# Patient Record
Sex: Female | Born: 1940 | Race: White | Marital: Married | Smoking: Never smoker
Health system: Southern US, Community
[De-identification: ages and names within clinical notes are randomized; demographics above are authoritative.]

## PROBLEM LIST (undated history)

## (undated) DIAGNOSIS — K5790 Diverticulosis of intestine, part unspecified, without perforation or abscess without bleeding: Secondary | ICD-10-CM

## (undated) DIAGNOSIS — M1990 Unspecified osteoarthritis, unspecified site: Secondary | ICD-10-CM

## (undated) DIAGNOSIS — E785 Hyperlipidemia, unspecified: Secondary | ICD-10-CM

## (undated) DIAGNOSIS — K635 Polyp of colon: Secondary | ICD-10-CM

## (undated) DIAGNOSIS — C539 Malignant neoplasm of cervix uteri, unspecified: Secondary | ICD-10-CM

## (undated) DIAGNOSIS — R55 Syncope and collapse: Secondary | ICD-10-CM

## (undated) DIAGNOSIS — S12100A Unspecified displaced fracture of second cervical vertebra, initial encounter for closed fracture: Secondary | ICD-10-CM

## (undated) DIAGNOSIS — B009 Herpesviral infection, unspecified: Secondary | ICD-10-CM

## (undated) DIAGNOSIS — S12000A Unspecified displaced fracture of first cervical vertebra, initial encounter for closed fracture: Secondary | ICD-10-CM

## (undated) DIAGNOSIS — G43909 Migraine, unspecified, not intractable, without status migrainosus: Secondary | ICD-10-CM

## (undated) DIAGNOSIS — I639 Cerebral infarction, unspecified: Secondary | ICD-10-CM

## (undated) HISTORY — DX: Polyp of colon: K63.5

## (undated) HISTORY — DX: Herpesviral infection, unspecified: B00.9

## (undated) HISTORY — DX: Syncope and collapse: R55

## (undated) HISTORY — PX: ABDOMINAL HYSTERECTOMY: SHX81

## (undated) HISTORY — DX: Hyperlipidemia, unspecified: E78.5

## (undated) HISTORY — DX: Cerebral infarction, unspecified: I63.9

## (undated) HISTORY — DX: Unspecified displaced fracture of first cervical vertebra, initial encounter for closed fracture: S12.000A

## (undated) HISTORY — DX: Migraine, unspecified, not intractable, without status migrainosus: G43.909

## (undated) HISTORY — DX: Diverticulosis of intestine, part unspecified, without perforation or abscess without bleeding: K57.90

## (undated) HISTORY — DX: Malignant neoplasm of cervix uteri, unspecified: C53.9

## (undated) HISTORY — DX: Unspecified osteoarthritis, unspecified site: M19.90

## (undated) HISTORY — DX: Unspecified displaced fracture of second cervical vertebra, initial encounter for closed fracture: S12.100A

---

## 1980-03-23 HISTORY — PX: COSMETIC SURGERY: SHX468

## 1983-03-24 HISTORY — PX: BREAST SURGERY: SHX581

## 1997-03-23 DIAGNOSIS — S12000A Unspecified displaced fracture of first cervical vertebra, initial encounter for closed fracture: Secondary | ICD-10-CM

## 1997-03-23 DIAGNOSIS — S12100A Unspecified displaced fracture of second cervical vertebra, initial encounter for closed fracture: Secondary | ICD-10-CM

## 1997-03-23 HISTORY — DX: Unspecified displaced fracture of first cervical vertebra, initial encounter for closed fracture: S12.000A

## 1997-03-23 HISTORY — DX: Unspecified displaced fracture of second cervical vertebra, initial encounter for closed fracture: S12.100A

## 1997-03-23 HISTORY — PX: OTHER SURGICAL HISTORY: SHX169

## 2004-06-05 ENCOUNTER — Ambulatory Visit: Payer: Self-pay | Admitting: Gastroenterology

## 2005-03-09 ENCOUNTER — Ambulatory Visit: Payer: Self-pay | Admitting: Chiropractic Medicine

## 2011-06-30 DIAGNOSIS — C44721 Squamous cell carcinoma of skin of unspecified lower limb, including hip: Secondary | ICD-10-CM | POA: Diagnosis not present

## 2011-06-30 DIAGNOSIS — L570 Actinic keratosis: Secondary | ICD-10-CM | POA: Diagnosis not present

## 2011-06-30 DIAGNOSIS — L578 Other skin changes due to chronic exposure to nonionizing radiation: Secondary | ICD-10-CM | POA: Diagnosis not present

## 2011-06-30 DIAGNOSIS — L819 Disorder of pigmentation, unspecified: Secondary | ICD-10-CM | POA: Diagnosis not present

## 2011-06-30 DIAGNOSIS — L821 Other seborrheic keratosis: Secondary | ICD-10-CM | POA: Diagnosis not present

## 2011-07-07 DIAGNOSIS — J309 Allergic rhinitis, unspecified: Secondary | ICD-10-CM | POA: Diagnosis not present

## 2011-07-07 DIAGNOSIS — L0291 Cutaneous abscess, unspecified: Secondary | ICD-10-CM | POA: Diagnosis not present

## 2011-07-07 DIAGNOSIS — J029 Acute pharyngitis, unspecified: Secondary | ICD-10-CM | POA: Diagnosis not present

## 2011-07-09 DIAGNOSIS — L989 Disorder of the skin and subcutaneous tissue, unspecified: Secondary | ICD-10-CM | POA: Diagnosis not present

## 2011-07-09 DIAGNOSIS — C44721 Squamous cell carcinoma of skin of unspecified lower limb, including hip: Secondary | ICD-10-CM | POA: Diagnosis not present

## 2011-08-10 DIAGNOSIS — C44721 Squamous cell carcinoma of skin of unspecified lower limb, including hip: Secondary | ICD-10-CM | POA: Diagnosis not present

## 2011-08-10 DIAGNOSIS — L821 Other seborrheic keratosis: Secondary | ICD-10-CM | POA: Diagnosis not present

## 2011-08-31 DIAGNOSIS — B009 Herpesviral infection, unspecified: Secondary | ICD-10-CM | POA: Diagnosis not present

## 2011-08-31 DIAGNOSIS — G43909 Migraine, unspecified, not intractable, without status migrainosus: Secondary | ICD-10-CM | POA: Diagnosis not present

## 2011-08-31 DIAGNOSIS — Z1239 Encounter for other screening for malignant neoplasm of breast: Secondary | ICD-10-CM | POA: Diagnosis not present

## 2011-09-04 DIAGNOSIS — I10 Essential (primary) hypertension: Secondary | ICD-10-CM | POA: Diagnosis not present

## 2011-12-07 DIAGNOSIS — J069 Acute upper respiratory infection, unspecified: Secondary | ICD-10-CM | POA: Diagnosis not present

## 2011-12-07 DIAGNOSIS — J209 Acute bronchitis, unspecified: Secondary | ICD-10-CM | POA: Diagnosis not present

## 2012-03-21 DIAGNOSIS — R509 Fever, unspecified: Secondary | ICD-10-CM | POA: Diagnosis not present

## 2012-03-21 DIAGNOSIS — J029 Acute pharyngitis, unspecified: Secondary | ICD-10-CM | POA: Diagnosis not present

## 2012-03-30 DIAGNOSIS — J069 Acute upper respiratory infection, unspecified: Secondary | ICD-10-CM | POA: Diagnosis not present

## 2012-05-14 DIAGNOSIS — J019 Acute sinusitis, unspecified: Secondary | ICD-10-CM | POA: Diagnosis not present

## 2012-05-14 DIAGNOSIS — J209 Acute bronchitis, unspecified: Secondary | ICD-10-CM | POA: Diagnosis not present

## 2012-07-06 DIAGNOSIS — I10 Essential (primary) hypertension: Secondary | ICD-10-CM | POA: Diagnosis not present

## 2012-07-06 DIAGNOSIS — R0789 Other chest pain: Secondary | ICD-10-CM | POA: Diagnosis not present

## 2012-07-06 DIAGNOSIS — Z1231 Encounter for screening mammogram for malignant neoplasm of breast: Secondary | ICD-10-CM | POA: Diagnosis not present

## 2012-07-06 DIAGNOSIS — Z0000 Encounter for general adult medical examination without abnormal findings: Secondary | ICD-10-CM | POA: Diagnosis not present

## 2012-08-19 DIAGNOSIS — R0789 Other chest pain: Secondary | ICD-10-CM | POA: Diagnosis not present

## 2012-09-21 DIAGNOSIS — R0789 Other chest pain: Secondary | ICD-10-CM | POA: Diagnosis not present

## 2012-11-08 DIAGNOSIS — R0789 Other chest pain: Secondary | ICD-10-CM | POA: Diagnosis not present

## 2012-11-10 DIAGNOSIS — E785 Hyperlipidemia, unspecified: Secondary | ICD-10-CM | POA: Diagnosis not present

## 2012-11-10 DIAGNOSIS — B009 Herpesviral infection, unspecified: Secondary | ICD-10-CM | POA: Diagnosis not present

## 2012-12-28 DIAGNOSIS — Z23 Encounter for immunization: Secondary | ICD-10-CM | POA: Diagnosis not present

## 2013-03-30 DIAGNOSIS — M1610 Unilateral primary osteoarthritis, unspecified hip: Secondary | ICD-10-CM | POA: Diagnosis not present

## 2013-03-30 DIAGNOSIS — N949 Unspecified condition associated with female genital organs and menstrual cycle: Secondary | ICD-10-CM | POA: Diagnosis not present

## 2013-03-30 DIAGNOSIS — M169 Osteoarthritis of hip, unspecified: Secondary | ICD-10-CM | POA: Diagnosis not present

## 2013-04-07 DIAGNOSIS — J069 Acute upper respiratory infection, unspecified: Secondary | ICD-10-CM | POA: Diagnosis not present

## 2013-04-07 DIAGNOSIS — J029 Acute pharyngitis, unspecified: Secondary | ICD-10-CM | POA: Diagnosis not present

## 2013-07-28 ENCOUNTER — Encounter (INDEPENDENT_AMBULATORY_CARE_PROVIDER_SITE_OTHER): Payer: Self-pay

## 2013-07-28 ENCOUNTER — Ambulatory Visit (INDEPENDENT_AMBULATORY_CARE_PROVIDER_SITE_OTHER): Payer: Medicare Other | Admitting: Adult Health

## 2013-07-28 ENCOUNTER — Encounter: Payer: Self-pay | Admitting: Adult Health

## 2013-07-28 VITALS — BP 130/78 | HR 75 | Temp 98.1°F | Resp 12 | Ht 63.0 in | Wt 144.8 lb

## 2013-07-28 DIAGNOSIS — G43909 Migraine, unspecified, not intractable, without status migrainosus: Secondary | ICD-10-CM | POA: Diagnosis not present

## 2013-07-28 DIAGNOSIS — B009 Herpesviral infection, unspecified: Secondary | ICD-10-CM

## 2013-07-28 NOTE — Progress Notes (Signed)
Pre visit review using our clinic review tool, if applicable. No additional management support is needed unless otherwise documented below in the visit note. 
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
## 2013-07-29 ENCOUNTER — Encounter: Payer: Self-pay | Admitting: Adult Health

## 2013-07-29 DIAGNOSIS — B009 Herpesviral infection, unspecified: Secondary | ICD-10-CM | POA: Insufficient documentation

## 2013-07-29 DIAGNOSIS — G43909 Migraine, unspecified, not intractable, without status migrainosus: Secondary | ICD-10-CM | POA: Insufficient documentation

## 2013-07-29 MED ORDER — VALACYCLOVIR HCL 500 MG PO TABS: 500.0000 mg | ORAL_TABLET | Freq: Every day | ORAL | Status: DC

## 2013-08-16 ENCOUNTER — Other Ambulatory Visit (HOSPITAL_COMMUNITY)
Admission: RE | Admit: 2013-08-16 | Discharge: 2013-08-16 | Disposition: A | Discharge status: Home / Self Care | Payer: Medicare Other | Source: Ambulatory Visit | Attending: Adult Health | Admitting: Adult Health

## 2013-08-16 ENCOUNTER — Encounter: Payer: Self-pay | Admitting: Adult Health

## 2013-08-16 ENCOUNTER — Ambulatory Visit (INDEPENDENT_AMBULATORY_CARE_PROVIDER_SITE_OTHER): Payer: Medicare Other | Admitting: Adult Health

## 2013-08-16 VITALS — BP 126/70 | HR 72 | Temp 98.3°F | Resp 14 | Ht 63.0 in | Wt 146.5 lb

## 2013-08-16 DIAGNOSIS — E785 Hyperlipidemia, unspecified: Secondary | ICD-10-CM | POA: Diagnosis not present

## 2013-08-16 DIAGNOSIS — Z1239 Encounter for other screening for malignant neoplasm of breast: Secondary | ICD-10-CM

## 2013-08-16 DIAGNOSIS — R5383 Other fatigue: Secondary | ICD-10-CM | POA: Diagnosis not present

## 2013-08-16 DIAGNOSIS — Z124 Encounter for screening for malignant neoplasm of cervix: Secondary | ICD-10-CM | POA: Diagnosis not present

## 2013-08-16 DIAGNOSIS — Z13820 Encounter for screening for osteoporosis: Secondary | ICD-10-CM | POA: Diagnosis not present

## 2013-08-16 DIAGNOSIS — R5381 Other malaise: Secondary | ICD-10-CM

## 2013-08-16 DIAGNOSIS — Z23 Encounter for immunization: Secondary | ICD-10-CM

## 2013-08-16 DIAGNOSIS — Z1151 Encounter for screening for human papillomavirus (HPV): Secondary | ICD-10-CM | POA: Diagnosis not present

## 2013-08-16 DIAGNOSIS — Z9189 Other specified personal risk factors, not elsewhere classified: Secondary | ICD-10-CM

## 2013-08-16 DIAGNOSIS — E538 Deficiency of other specified B group vitamins: Secondary | ICD-10-CM

## 2013-08-16 DIAGNOSIS — I10 Essential (primary) hypertension: Secondary | ICD-10-CM | POA: Diagnosis not present

## 2013-08-16 DIAGNOSIS — E559 Vitamin D deficiency, unspecified: Secondary | ICD-10-CM

## 2013-08-16 DIAGNOSIS — Z0000 Encounter for general adult medical examination without abnormal findings: Secondary | ICD-10-CM | POA: Diagnosis not present

## 2013-08-16 LAB — CBC WITH DIFFERENTIAL/PLATELET
BASOS ABS: 0 10*3/uL (ref 0.0–0.1)
Basophils Relative: 0.4 % (ref 0.0–3.0)
EOS ABS: 0.1 10*3/uL (ref 0.0–0.7)
Eosinophils Relative: 1.2 % (ref 0.0–5.0)
HCT: 38 % (ref 36.0–46.0)
HEMOGLOBIN: 12.8 g/dL (ref 12.0–15.0)
Lymphocytes Relative: 28.4 % (ref 12.0–46.0)
Lymphs Abs: 1.7 10*3/uL (ref 0.7–4.0)
MCHC: 33.6 g/dL (ref 30.0–36.0)
MCV: 86 fl (ref 78.0–100.0)
Monocytes Absolute: 0.5 10*3/uL (ref 0.1–1.0)
Monocytes Relative: 7.8 % (ref 3.0–12.0)
NEUTROS ABS: 3.7 10*3/uL (ref 1.4–7.7)
Neutrophils Relative %: 62.2 % (ref 43.0–77.0)
Platelets: 250 10*3/uL (ref 150.0–400.0)
RBC: 4.41 Mil/uL (ref 3.87–5.11)
RDW: 13.6 % (ref 11.5–15.5)
WBC: 5.9 10*3/uL (ref 4.0–10.5)

## 2013-08-16 LAB — LIPID PANEL
CHOLESTEROL: 217 mg/dL — AB (ref 0–200)
HDL: 56.4 mg/dL (ref >39.00)
LDL Cholesterol: 141 mg/dL — ABNORMAL HIGH (ref 0–99)
Total CHOL/HDL Ratio: 4
Triglycerides: 99 mg/dL (ref 0.0–149.0)
VLDL: 19.8 mg/dL (ref 0.0–40.0)

## 2013-08-16 LAB — VITAMIN B12: VITAMIN B 12: 627 pg/mL (ref 211–911)

## 2013-08-16 LAB — COMPREHENSIVE METABOLIC PANEL
ALBUMIN: 4 g/dL (ref 3.5–5.2)
ALT: 18 U/L (ref 0–35)
AST: 21 U/L (ref 0–37)
Alkaline Phosphatase: 71 U/L (ref 39–117)
BUN: 13 mg/dL (ref 6–23)
CO2: 25 mEq/L (ref 19–32)
Calcium: 9.2 mg/dL (ref 8.4–10.5)
Chloride: 104 mEq/L (ref 96–112)
Creatinine, Ser: 0.8 mg/dL (ref 0.4–1.2)
GFR: 79.11 mL/min (ref >60.00)
GLUCOSE: 94 mg/dL (ref 70–99)
POTASSIUM: 3.9 meq/L (ref 3.5–5.1)
Sodium: 138 mEq/L (ref 135–145)
Total Bilirubin: 0.5 mg/dL (ref 0.2–1.2)
Total Protein: 6.9 g/dL (ref 6.0–8.3)

## 2013-08-16 LAB — TSH: TSH: 1 u[IU]/mL (ref 0.35–4.50)

## 2013-08-16 MED ORDER — ZOSTER VACCINE LIVE 19400 UNT/0.65ML ~~LOC~~ SOLR: 0.6500 mL | Freq: Once | SUBCUTANEOUS | Status: DC

## 2013-08-16 MED ORDER — LISINOPRIL 10 MG PO TABS
10.0000 mg | ORAL_TABLET | Freq: Every day | ORAL | Status: DC
Start: 2013-08-16 — End: 2014-08-16
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
## 2013-08-16 NOTE — Progress Notes (Signed)
Pre visit review using our clinic review tool, if applicable. No additional management support is needed unless otherwise documented below in the visit note. 

## 2013-08-16 NOTE — Patient Instructions (Signed)
  You had your medicare wellness exam today.  Please have labs drawn prior to leaving the office. We will contact you with results once they are available as well as any other instructions.  I have sent in a prescription for the shingles vaccine to your pharmacy for them to administer.  You received your Prevnar vaccine today (this is a pneumonia vaccine covered by Medicare).  Please schedule your mammogram at [facility] and have them send me the results.  We will call you with an appointment for your bone density exam.   I have sent in a refill for lisinopril to your pharmacy.

## 2013-08-17 LAB — VITAMIN D 25 HYDROXY (VIT D DEFICIENCY, FRACTURES): Vit D, 25-Hydroxy: 38 ng/mL (ref 30–89)

## 2013-08-23 ENCOUNTER — Encounter: Payer: Self-pay | Admitting: *Deleted

## 2013-09-20 DIAGNOSIS — H35369 Drusen (degenerative) of macula, unspecified eye: Secondary | ICD-10-CM | POA: Diagnosis not present

## 2013-09-20 DIAGNOSIS — H11009 Unspecified pterygium of unspecified eye: Secondary | ICD-10-CM | POA: Diagnosis not present

## 2013-09-20 DIAGNOSIS — H5210 Myopia, unspecified eye: Secondary | ICD-10-CM | POA: Diagnosis not present

## 2013-09-20 DIAGNOSIS — H2510 Age-related nuclear cataract, unspecified eye: Secondary | ICD-10-CM | POA: Diagnosis not present

## 2013-11-17 ENCOUNTER — Telehealth: Payer: Self-pay | Admitting: Internal Medicine

## 2013-11-17 MED ORDER — BUTALBITAL-APAP-CAFFEINE 50-325-40 MG PO TABS: 1.0000 | ORAL_TABLET | Freq: Two times a day (BID) | ORAL | Status: DC | PRN

## 2013-11-17 NOTE — Telephone Encounter (Signed)
Ok to refill,  printed rx  

## 2013-11-17 NOTE — Telephone Encounter (Signed)
Script sent as requested. 

## 2013-11-17 NOTE — Telephone Encounter (Signed)
Called for refill on Fioricet please advise

## 2013-12-29 ENCOUNTER — Telehealth: Payer: Self-pay | Admitting: Adult Health

## 2013-12-29 NOTE — Telephone Encounter (Signed)
Patient called left message needing medication refill but di not leave the medication name. Returned call to patient but no answer an Unable to leave message.

## 2014-01-01 NOTE — Telephone Encounter (Signed)
Called, left message for patient. Advised to contact pharmacy for refill request. Requested call back if needing any further assistance

## 2014-03-26 DIAGNOSIS — Z23 Encounter for immunization: Secondary | ICD-10-CM | POA: Diagnosis not present

## 2014-05-29 DIAGNOSIS — M62830 Muscle spasm of back: Secondary | ICD-10-CM | POA: Diagnosis not present

## 2014-05-29 DIAGNOSIS — M9903 Segmental and somatic dysfunction of lumbar region: Secondary | ICD-10-CM | POA: Diagnosis not present

## 2014-05-29 DIAGNOSIS — M955 Acquired deformity of pelvis: Secondary | ICD-10-CM | POA: Diagnosis not present

## 2014-05-29 DIAGNOSIS — M9905 Segmental and somatic dysfunction of pelvic region: Secondary | ICD-10-CM | POA: Diagnosis not present

## 2014-05-31 DIAGNOSIS — M955 Acquired deformity of pelvis: Secondary | ICD-10-CM | POA: Diagnosis not present

## 2014-05-31 DIAGNOSIS — M9905 Segmental and somatic dysfunction of pelvic region: Secondary | ICD-10-CM | POA: Diagnosis not present

## 2014-05-31 DIAGNOSIS — M62830 Muscle spasm of back: Secondary | ICD-10-CM | POA: Diagnosis not present

## 2014-05-31 DIAGNOSIS — M9903 Segmental and somatic dysfunction of lumbar region: Secondary | ICD-10-CM | POA: Diagnosis not present

## 2014-06-18 ENCOUNTER — Other Ambulatory Visit: Payer: Self-pay | Admitting: Internal Medicine

## 2014-06-19 ENCOUNTER — Encounter: Payer: Self-pay | Admitting: Nurse Practitioner

## 2014-06-19 ENCOUNTER — Ambulatory Visit (INDEPENDENT_AMBULATORY_CARE_PROVIDER_SITE_OTHER): Payer: Medicare Other | Admitting: Nurse Practitioner

## 2014-06-19 ENCOUNTER — Ambulatory Visit (INDEPENDENT_AMBULATORY_CARE_PROVIDER_SITE_OTHER)
Admission: RE | Admit: 2014-06-19 | Discharge: 2014-06-19 | Disposition: A | Discharge status: Home / Self Care | Payer: Medicare Other | Source: Ambulatory Visit | Attending: Nurse Practitioner | Admitting: Nurse Practitioner

## 2014-06-19 VITALS — BP 130/70 | HR 74 | Temp 98.3°F | Resp 14 | Ht 63.0 in | Wt 143.8 lb

## 2014-06-19 DIAGNOSIS — S8992XA Unspecified injury of left lower leg, initial encounter: Secondary | ICD-10-CM | POA: Diagnosis not present

## 2014-06-19 DIAGNOSIS — M25562 Pain in left knee: Secondary | ICD-10-CM

## 2014-06-19 MED ORDER — KETOPROFEN 75 MG PO CAPS: 75.0000 mg | ORAL_CAPSULE | Freq: Three times a day (TID) | ORAL | Status: DC

## 2014-06-19 NOTE — Patient Instructions (Signed)
[place] at [facility] Address:  Address: [address], [place] [address]  [phone] X-rays until 4 pm  Knee Pain The knee is the complex joint between your thigh and your lower leg. It is made up of bones, tendons, ligaments, and cartilage. The bones that make up the knee are:  The femur in the thigh.  The tibia and fibula in the lower leg.  The patella or kneecap riding in the groove on the lower femur. CAUSES  Knee pain is a common complaint with many causes. A few of these causes are:  Injury, such as:  A ruptured ligament or tendon injury.  Torn cartilage.  Medical conditions, such as:  Gout  Arthritis  Infections  Overuse, over training, or overdoing a physical activity. Knee pain can be minor or severe. Knee pain can accompany debilitating injury. Minor knee problems often respond well to self-care measures or get well on their own. More serious injuries may need medical intervention or even surgery. SYMPTOMS The knee is complex. Symptoms of knee problems can vary widely. Some of the problems are:  Pain with movement and weight bearing.  Swelling and tenderness.  Buckling of the knee.  Inability to straighten or extend your knee.  Your knee locks and you cannot straighten it.  Warmth and redness with pain and fever.  Deformity or dislocation of the kneecap. DIAGNOSIS  Determining what is wrong may be very straight forward such as when there is an injury. It can also be challenging because of the complexity of the knee. Tests to make a diagnosis may include:  Your caregiver taking a history and doing a physical exam.  Routine X-rays can be used to rule out other problems. X-rays will not reveal a cartilage tear. Some injuries of the knee can be diagnosed by:  Arthroscopy a surgical technique by which a small video camera is inserted through tiny incisions on the sides of the knee. This procedure is used to examine and  repair internal knee joint problems. Tiny instruments can be used during arthroscopy to repair the torn knee cartilage (meniscus).  Arthrography is a radiology technique. A contrast liquid is directly injected into the knee joint. Internal structures of the knee joint then become visible on X-ray film.  An MRI scan is a non X-ray radiology procedure in which magnetic fields and a computer produce two- or three-dimensional images of the inside of the knee. Cartilage tears are often visible using an MRI scanner. MRI scans have largely replaced arthrography in diagnosing cartilage tears of the knee.  Blood work.  Examination of the fluid that helps to lubricate the knee joint (synovial fluid). This is done by taking a sample out using a needle and a syringe. TREATMENT The treatment of knee problems depends on the cause. Some of these treatments are:  Depending on the injury, proper casting, splinting, surgery, or physical therapy care will be needed.  Give yourself adequate recovery time. Do not overuse your joints. If you begin to get sore during workout routines, back off. Slow down or do fewer repetitions.  For repetitive activities such as cycling or running, maintain your strength and nutrition.  Alternate muscle groups. For example, if you are a weight lifter, work the upper body on one day and the lower body the next.  Either tight or weak muscles do not give the proper support for your knee. Tight or weak muscles do not absorb the stress placed on the knee joint.  Keep the muscles surrounding the knee strong.  Take care of mechanical problems.  If you have flat feet, orthotics or special shoes may help. See your caregiver if you need help.  Arch supports, sometimes with wedges on the inner or outer aspect of the heel, can help. These can shift pressure away from the side of the knee most bothered by osteoarthritis.  A brace called an "unloader" brace also may be used to help ease the  pressure on the most arthritic side of the knee.  If your caregiver has prescribed crutches, braces, wraps or ice, use as directed. The acronym for this is PRICE. This means protection, rest, ice, compression, and elevation.  Nonsteroidal anti-inflammatory drugs (NSAIDs), can help relieve pain. But if taken immediately after an injury, they may actually increase swelling. Take NSAIDs with food in your stomach. Stop them if you develop stomach problems. Do not take these if you have a history of ulcers, stomach pain, or bleeding from the bowel. Do not take without your caregiver's approval if you have problems with fluid retention, heart failure, or kidney problems.  For ongoing knee problems, physical therapy may be helpful.  Glucosamine and chondroitin are over-the-counter dietary supplements. Both may help relieve the pain of osteoarthritis in the knee. These medicines are different from the usual anti-inflammatory drugs. Glucosamine may decrease the rate of cartilage destruction.  Injections of a corticosteroid drug into your knee joint may help reduce the symptoms of an arthritis flare-up. They may provide pain relief that lasts a few months. You may have to wait a few months between injections. The injections do have a small increased risk of infection, water retention, and elevated blood sugar levels.  Hyaluronic acid injected into damaged joints may ease pain and provide lubrication. These injections may work by reducing inflammation. A series of shots may give relief for as long as 6 months.  Topical painkillers. Applying certain ointments to your skin may help relieve the pain and stiffness of osteoarthritis. Ask your pharmacist for suggestions. Many over the-counter products are approved for temporary relief of arthritis pain.  In some countries, doctors often prescribe topical NSAIDs for relief of chronic conditions such as arthritis and tendinitis. A review of treatment with NSAID creams  found that they worked as well as oral medications but without the serious side effects. PREVENTION  Maintain a healthy weight. Extra pounds put more strain on your joints.  Get strong, stay limber. Weak muscles are a common cause of knee injuries. Stretching is important. Include flexibility exercises in your workouts.  Be smart about exercise. If you have osteoarthritis, chronic knee pain or recurring injuries, you may need to change the way you exercise. This does not mean you have to stop being active. If your knees ache after jogging or playing basketball, consider switching to swimming, water aerobics, or other low-impact activities, at least for a few days a week. Sometimes limiting high-impact activities will provide relief.  Make sure your shoes fit well. Choose footwear that is right for your sport.  Protect your knees. Use the proper gear for knee-sensitive activities. Use kneepads when playing volleyball or laying carpet. Buckle your seat belt every time you drive. Most shattered kneecaps occur in car accidents.  Rest when you are tired. SEEK MEDICAL CARE IF:  You have knee pain that is continual and does not seem to be getting better.  SEEK IMMEDIATE MEDICAL CARE IF:  Your knee joint feels hot to the touch and you have a  high fever. MAKE SURE YOU:   Understand these instructions.  Will watch your condition.  Will get help right away if you are not doing well or get worse. Document Released: 01/04/2007 Document Revised: 06/01/2011 Document Reviewed: 01/04/2007 [facility] Patient Information 2015 [place], [place]. This information is not intended to replace advice given to you by your health care provider. Make sure you discuss any questions you have with your health care provider.
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
## 2014-06-19 NOTE — Progress Notes (Signed)
Pre visit review using our clinic review tool, if applicable. No additional management support is needed unless otherwise documented below in the visit note. 

## 2014-06-22 ENCOUNTER — Other Ambulatory Visit: Payer: Self-pay | Admitting: Nurse Practitioner

## 2014-06-22 DIAGNOSIS — M25562 Pain in left knee: Secondary | ICD-10-CM

## 2014-06-23 DIAGNOSIS — M25562 Pain in left knee: Secondary | ICD-10-CM | POA: Insufficient documentation

## 2014-06-23 NOTE — Assessment & Plan Note (Signed)
Refill ketoprofen since it is helpful to pt. Left knee x-ray at [place]. Will follow after results.

## 2014-07-06 ENCOUNTER — Telehealth: Payer: Self-pay | Admitting: Internal Medicine

## 2014-07-06 DIAGNOSIS — M47812 Spondylosis without myelopathy or radiculopathy, cervical region: Secondary | ICD-10-CM | POA: Diagnosis not present

## 2014-07-06 DIAGNOSIS — M542 Cervicalgia: Secondary | ICD-10-CM | POA: Diagnosis not present
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
## 2014-07-06 NOTE — Telephone Encounter (Signed)
Patient currently at [facility]. FYI

## 2014-07-09 ENCOUNTER — Encounter: Payer: Self-pay | Admitting: Adult Health

## 2014-08-16 ENCOUNTER — Other Ambulatory Visit: Payer: Self-pay | Admitting: *Deleted

## 2014-08-16 ENCOUNTER — Encounter: Payer: Self-pay | Admitting: Nurse Practitioner

## 2014-08-16 ENCOUNTER — Ambulatory Visit (INDEPENDENT_AMBULATORY_CARE_PROVIDER_SITE_OTHER): Payer: Medicare Other | Admitting: Nurse Practitioner

## 2014-08-16 VITALS — BP 122/72 | HR 73 | Temp 98.3°F | Resp 14 | Ht 63.0 in | Wt 141.8 lb

## 2014-08-16 DIAGNOSIS — Z0000 Encounter for general adult medical examination without abnormal findings: Secondary | ICD-10-CM | POA: Diagnosis not present

## 2014-08-16 DIAGNOSIS — M542 Cervicalgia: Secondary | ICD-10-CM

## 2014-08-16 DIAGNOSIS — Z131 Encounter for screening for diabetes mellitus: Secondary | ICD-10-CM | POA: Diagnosis not present

## 2014-08-16 DIAGNOSIS — Z13220 Encounter for screening for lipoid disorders: Secondary | ICD-10-CM

## 2014-08-16 DIAGNOSIS — Z1239 Encounter for other screening for malignant neoplasm of breast: Secondary | ICD-10-CM

## 2014-08-16 MED ORDER — LISINOPRIL 10 MG PO TABS: 10.0000 mg | ORAL_TABLET | Freq: Every day | ORAL | Status: DC

## 2014-08-16 NOTE — Progress Notes (Signed)
Pre visit review using our clinic review tool, if applicable. No additional management support is needed unless otherwise documented below in the visit note. 

## 2014-08-16 NOTE — Progress Notes (Signed)
   Subjective:    Patient ID: Audrey Cross, female    DOB: [birth year], 74 y.o.   MRN: [id number]  HPI  Audrey Cross is a 74 yo female here for a annual wellness visit.   [place] referral  Pap Smear-2015  Colonoscopy- 2011 [facility] Bone Density- 2008 [facility] Glaucoma- 3 months ago screening  Hearing - No concerns  Hemoglobin A1C- Will obtain today Cholesterol- Will obtain today  Social  Alcohol intake- Denies Smoking history- Denies  Smokers in home- Denies  Illicit drug use- Denies Exercise- Active  Diet- No formal diet  Sexually Active- Denies  Multiple Partners- Denies  Safety  Patient feels safe at home.-Yes  Patient does have smoke detectors at home- Yes Patient does wear sunscreen or protective clothing when in direct sunlight- Yes Patient does wear seat belt when driving or riding with others.- Yes  Activities of Daily Living Patient can do her own household chores. Denies needing assistance with: driving, feeding themselves, getting from bed to chair, getting to the toilet, bathing/showering, dressing, managing money, climbing flight of stairs, or preparing meals.   Depression Screen Patient denies losing interest in daily life, feeling hopeless, or crying easily over simple problems.   Fall Screen Patient has fallen in the last year. Tripped over a pocket book strap on the floor.   Memory Screen Patient denies problems with memory, misplacing items, and is able to balance checkbook/bank accounts. Patient is alert, normal appearance, oriented to person/place/and time. Correctly identified the president of the [place], recall of 3/3 objects, and performing simple calculations.  Patient displays appropriate judgement and can read correct time from watch face.   Immunizations The following Immunizations are up to date: Influenza, pneumonia, and tetanus.   Other Providers Patient Care Team: Rubbie Battiest, NP as PCP - General (Nurse  Practitioner) Eye Doctor- Dr. Matilde Sprang   Review of Systems No ROS     Objective:   Physical Exam BP 122/72 mmHg  Pulse 73  Temp(Src) 98.3 F (36.8 C)  Resp 14  Ht 5\' 3"  (1.6 m)  Wt 141 lb 12.8 oz (64.32 kg)  BMI 25.13 kg/m2  SpO2 97%  LMP        Assessment & Plan:  This is a routine wellness examination for this patient.  I reviewed all health maintenance protocols including mammography, colonoscopy, bone density.  Needed referrals placed. Age and diagnosis appropriate screening labs were ordered.  Her immunization history was reviewed and appropriate vaccinations were ordered.  Her current medications and allergies were reviewed and needed refills of her chronic medications were ordered if needed.  The plan for yearly health maintenance was discussed.    MEDICARE ATTESTATION  I have personally reviewed:  The patient's medical and social history. The use of alcohol, tobacco and illicit drugs. The current medications and supplements. The patient's function ability including ADLs, fall risks, home safety risks, cognitive, and hearing and visual impairment.   Diet and physical activities. Evaluation for depression and mood disorders.    The patient's weight, height, BMI and visual acuity have been recorded in the chart. I have made referrals, counseled and provided education to the patient based on review of the above.

## 2014-08-16 NOTE — Patient Instructions (Signed)

## 2014-09-02 ENCOUNTER — Encounter: Payer: Self-pay | Admitting: Nurse Practitioner

## 2014-09-07 ENCOUNTER — Other Ambulatory Visit: Payer: Self-pay | Admitting: Nurse Practitioner

## 2014-11-10 DIAGNOSIS — L237 Allergic contact dermatitis due to plants, except food: Secondary | ICD-10-CM | POA: Diagnosis not present

## 2015-01-10 DIAGNOSIS — M25562 Pain in left knee: Secondary | ICD-10-CM | POA: Diagnosis not present

## 2015-01-11 ENCOUNTER — Telehealth: Payer: Self-pay | Admitting: *Deleted

## 2015-01-11 DIAGNOSIS — Z0279 Encounter for issue of other medical certificate: Secondary | ICD-10-CM

## 2015-01-11 NOTE — Telephone Encounter (Signed)
Received and put in Carrie's folder

## 2015-01-11 NOTE — Telephone Encounter (Signed)
Patient dropped off forms to be filled out for a driving placard. Forms are in Carrie's box-thanks

## 2015-03-02 ENCOUNTER — Other Ambulatory Visit: Payer: Self-pay | Admitting: Nurse Practitioner

## 2015-04-15 DIAGNOSIS — R6889 Other general symptoms and signs: Secondary | ICD-10-CM | POA: Diagnosis not present

## 2015-04-15 DIAGNOSIS — J029 Acute pharyngitis, unspecified: Secondary | ICD-10-CM | POA: Diagnosis not present

## 2015-04-19 ENCOUNTER — Ambulatory Visit (INDEPENDENT_AMBULATORY_CARE_PROVIDER_SITE_OTHER): Payer: Medicare Other | Admitting: Family Medicine

## 2015-04-19 ENCOUNTER — Ambulatory Visit (INDEPENDENT_AMBULATORY_CARE_PROVIDER_SITE_OTHER)
Admission: RE | Admit: 2015-04-19 | Discharge: 2015-04-19 | Disposition: A | Discharge status: Home / Self Care | Payer: Medicare Other | Source: Ambulatory Visit | Attending: Family Medicine | Admitting: Family Medicine

## 2015-04-19 ENCOUNTER — Encounter: Payer: Self-pay | Admitting: Family Medicine

## 2015-04-19 VITALS — BP 134/68 | HR 98 | Temp 99.4°F | Ht 63.0 in | Wt 132.5 lb

## 2015-04-19 DIAGNOSIS — J189 Pneumonia, unspecified organism: Secondary | ICD-10-CM

## 2015-04-19 DIAGNOSIS — R059 Cough, unspecified: Secondary | ICD-10-CM

## 2015-04-19 DIAGNOSIS — R509 Fever, unspecified: Secondary | ICD-10-CM | POA: Diagnosis not present

## 2015-04-19 DIAGNOSIS — R05 Cough: Secondary | ICD-10-CM

## 2015-04-19 DIAGNOSIS — J988 Other specified respiratory disorders: Secondary | ICD-10-CM | POA: Diagnosis not present

## 2015-04-19 MED ORDER — ALBUTEROL SULFATE HFA 108 (90 BASE) MCG/ACT IN AERS: 2.0000 | INHALATION_SPRAY | Freq: Four times a day (QID) | RESPIRATORY_TRACT | Status: DC | PRN

## 2015-04-19 MED ORDER — HYDROCOD POLST-CPM POLST ER 10-8 MG/5ML PO SUER: 5.0000 mL | Freq: Two times a day (BID) | ORAL | Status: DC | PRN

## 2015-04-19 MED ORDER — PREDNISONE 50 MG PO TABS: ORAL_TABLET | ORAL | Status: DC

## 2015-04-19 MED ORDER — MOXIFLOXACIN HCL 400 MG PO TABS: 400.0000 mg | ORAL_TABLET | Freq: Every day | ORAL | Status: DC

## 2015-04-19 NOTE — Progress Notes (Addendum)
Subjective:  Patient ID: Audrey Cross, female    DOB: 1941-03-02  Age: 75 y.o. MRN: [id number]  CC: Fever, chills, cough  HPI:  75 year old female presents to clinic today with the above complaints.  Patient states she's not been feeling well since this past Sunday. She states that she's been running a fever, Tmax 101.9.  She's been experiencing severe cough.  She was seen at [facility] on 1/23 and was treated for flu. She was also given a cough suppressant.   Patient continues to feel poorly. Fever is now resolved but she's been expressing severe cough. Cough is productive. It interferes with sleep. She also reports associated wheezing. Been using cough medication with no relief. No known exacerbating factors. Additionally, she continues to report body aches and generalized malaise.  Social Hx   Social History   Social History  . Marital Status: Married    Spouse Name: N/A  . Number of Children: 2  . Years of Education: N/A   Occupational History  . 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 Smoking status: Never Smoker   . Smokeless tobacco: Never Used  . Alcohol Use: No  . Drug Use: No  . Sexual Activity: Not Asked   Other Topics Concern  . None   Social History Narrative   Daughters, Audrey Cross (born 35) and Audrey Cross (born 1964)   Review of Systems  Constitutional: Positive for fever and chills.  Respiratory: Positive for cough and shortness of breath.   Musculoskeletal: Positive for myalgias.   Objective:  BP 134/68 mmHg  Pulse 98  Temp(Src) 99.4 F (37.4 C) (Oral)  Ht 5\' 3"  (1.6 m)  Wt 132 lb 8 oz (60.102 kg)  BMI 23.48 kg/m2  SpO2 96%  BP/Weight 04/19/2015 08/16/2014 0000000  Systolic BP [id number]  Diastolic BP 68 72 70  Wt. (Lbs) 132.5 141.8 143.8  BMI 23.48 25.13 25.48   Physical Exam  Constitutional: She appears well-developed.  Appear sick. Severe coughing noted. Patient no  acute distress.  HENT:  Head: Normocephalic and atraumatic.  Eyes: Conjunctivae are normal.  Cardiovascular: Regular rhythm.  Tachycardia present.   Pulmonary/Chest: Effort normal.  Diffuse wheezing and coarse breath sounds throughout.  Neurological: She is alert.  Psychiatric: She has a normal mood and affect.  Vitals reviewed.  Lab Results  Component Value Date   WBC 5.9 08/16/2013   HGB 12.8 08/16/2013   HCT 38.0 08/16/2013   PLT 250.0 08/16/2013   GLUCOSE 94 08/16/2013   CHOL 217* 08/16/2013   TRIG 99.0 08/16/2013   HDL 56.40 08/16/2013   LDLCALC 141* 08/16/2013   ALT 18 08/16/2013   AST 21 08/16/2013   NA 138 08/16/2013   K 3.9 08/16/2013   CL 104 08/16/2013   CREATININE 0.8 08/16/2013   BUN 13 08/16/2013   CO2 25 08/16/2013   TSH 1.00 08/16/2013    Assessment & Plan:   Problem List Items Addressed This Visit    CAP (community acquired pneumonia) - Primary    New problem. Chest xray was obtained. It revealed findings consistent with pneumonia. Treating with Avelox, PRN Albuterol, Prednisone, Tussionex.      Relevant Medications   chlorpheniramine-HYDROcodone (TUSSIONEX PENNKINETIC ER) 10-8 MG/5ML SUER   albuterol (PROVENTIL HFA;VENTOLIN HFA) 108 (90 Base) MCG/ACT inhaler   moxifloxacin (AVELOX) 400 MG tablet    Other Visit Diagnoses  Cough        Relevant Medications    chlorpheniramine-HYDROcodone (TUSSIONEX PENNKINETIC ER) 10-8 MG/5ML SUER    predniSONE (DELTASONE) 50 MG tablet    albuterol (PROVENTIL HFA;VENTOLIN HFA) 108 (90 Base) MCG/ACT inhaler    Other Relevant Orders    DG Chest 2 View (Completed)       Meds ordered this encounter  Medications  . chlorpheniramine-HYDROcodone (TUSSIONEX PENNKINETIC ER) 10-8 MG/5ML SUER    Sig: Take 5 mLs by mouth every 12 (twelve) hours as needed.    Dispense:  115 mL    Refill:  0  . predniSONE (DELTASONE) 50 MG tablet    Sig: 1 tablet daily x 5 days.    Dispense:  5 tablet    Refill:  0  . albuterol  (PROVENTIL HFA;VENTOLIN HFA) 108 (90 Base) MCG/ACT inhaler    Sig: Inhale 2 puffs into the lungs every 6 (six) hours as needed for wheezing or shortness of breath.    Dispense:  1 Inhaler    Refill:  0  . moxifloxacin (AVELOX) 400 MG tablet    Sig: Take 1 tablet (400 mg total) by mouth daily.    Dispense:  7 tablet    Refill:  0    Follow-up: Return if symptoms worsen or fail to improve.  [place]

## 2015-04-19 NOTE — Progress Notes (Signed)
Pre visit review using our clinic review tool, if applicable. No additional management support is needed unless otherwise documented below in the visit note. 

## 2015-04-19 NOTE — Addendum Note (Signed)
Addended by: Coral Spikes on: 04/19/2015 11:10 AM   Modules accepted: Orders

## 2015-04-19 NOTE — Patient Instructions (Signed)
Take the medication as prescribed.  We will call with your lab results.  Follow up as needed.  Take care  Dr. Lacinda Axon

## 2015-04-19 NOTE — Assessment & Plan Note (Addendum)
New problem. Chest xray was obtained. It revealed findings consistent with pneumonia. Treating with Avelox, PRN Albuterol, Prednisone, Tussionex.

## 2015-04-26 ENCOUNTER — Ambulatory Visit (INDEPENDENT_AMBULATORY_CARE_PROVIDER_SITE_OTHER): Payer: Medicare Other | Admitting: Family Medicine

## 2015-04-26 ENCOUNTER — Encounter: Payer: Self-pay | Admitting: Family Medicine

## 2015-04-26 VITALS — BP 124/70 | HR 82 | Temp 98.0°F | Ht 63.0 in | Wt 133.2 lb

## 2015-04-26 DIAGNOSIS — J189 Pneumonia, unspecified organism: Secondary | ICD-10-CM

## 2015-04-26 LAB — CBC
HCT: 38.9 % (ref 36.0–46.0)
Hemoglobin: 12.8 g/dL (ref 12.0–15.0)
MCHC: 32.8 g/dL (ref 30.0–36.0)
MCV: 85 fl (ref 78.0–100.0)
Platelets: 295 K/uL (ref 150.0–400.0)
RBC: 4.58 Mil/uL (ref 3.87–5.11)
RDW: 14 % (ref 11.5–15.5)
WBC: 10.9 K/uL — ABNORMAL HIGH (ref 4.0–10.5)

## 2015-04-26 LAB — COMPREHENSIVE METABOLIC PANEL
ALBUMIN: 3.9 g/dL (ref 3.5–5.2)
ALT: 16 U/L (ref 0–35)
AST: 13 U/L (ref 0–37)
Alkaline Phosphatase: 60 U/L (ref 39–117)
BUN: 15 mg/dL (ref 6–23)
CHLORIDE: 101 meq/L (ref 96–112)
CO2: 29 meq/L (ref 19–32)
CREATININE: 0.65 mg/dL (ref 0.40–1.20)
Calcium: 9 mg/dL (ref 8.4–10.5)
GFR: 94.32 mL/min (ref >60.00)
GLUCOSE: 104 mg/dL — AB (ref 70–99)
POTASSIUM: 4.6 meq/L (ref 3.5–5.1)
SODIUM: 139 meq/L (ref 135–145)
Total Bilirubin: 0.3 mg/dL (ref 0.2–1.2)
Total Protein: 6.2 g/dL (ref 6.0–8.3)

## 2015-04-26 MED ORDER — HYDROCOD POLST-CPM POLST ER 10-8 MG/5ML PO SUER: 5.0000 mL | Freq: Two times a day (BID) | ORAL | Status: DC | PRN

## 2015-04-26 MED ORDER — PREDNISONE 10 MG PO TABS: ORAL_TABLET | ORAL | Status: DC

## 2015-04-26 MED ORDER — MOXIFLOXACIN HCL 400 MG PO TABS: 400.0000 mg | ORAL_TABLET | Freq: Every day | ORAL | Status: DC

## 2015-04-26 NOTE — Progress Notes (Signed)
Pre visit review using our clinic review tool, if applicable. No additional management support is needed unless otherwise documented below in the visit note. 

## 2015-04-26 NOTE — Assessment & Plan Note (Signed)
Patient with recent CAP. No significant improvement. Given physical exam findings/clinical picture, will extend course of antibiotics for an additional 7 days. Giving an extended course of prednisone given significant wheezing on exam. Tussionex PRN.

## 2015-04-26 NOTE — Patient Instructions (Signed)
Take the antibiotic x 1 week.  Use the cough syrup as needed.  I have started you on a longer course of prednisone.  Follow up if you fail to improve.  Dr. Lacinda Axon

## 2015-04-26 NOTE — Progress Notes (Addendum)
Subjective:  Patient ID: Audrey Cross, female    DOB: 17-Dec-1940  Age: 75 y.o. MRN: [id number]  CC: Recent CAP, still feeling poorly.  HPI:  75 year old female presents with the above issues.  CAP  Patient was recently seen on 1/27.  At that time she was diagnosed with community acquired pneumonia following chest x-ray.  She was treated with prednisone, Avelox, and PRN Tussionex.  She presents today stating that she feels horrible.  She states that she's had some improvement in her cough but it is still persistent.  Cough is mildly productive. She reports significant fatigue and generalized malaise.  He is concerned that she has not resolved her pneumonia.  No known exacerbating factors.  No recent fever.  Social Hx   Social History   Social History  . Marital Status: Married    Spouse Name: N/A  . Number of Children: 2  . Years of Education: N/A   Occupational History  . 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 Smoking status: Never Smoker   . Smokeless tobacco: Never Used  . Alcohol Use: No  . Drug Use: No  . Sexual Activity: Not Asked   Other Topics Concern  . None   Social History Narrative   Daughters, Judeen Hammans (born 20) and Suanne Marker (born 1964)   Review of Systems  Constitutional: Positive for fatigue. Negative for fever.  Respiratory: Positive for cough.    Objective:  BP 124/70 mmHg  Pulse 82  Temp(Src) 98 F (36.7 C) (Oral)  Ht _0  (1.6 m)  Wt 133 lb 4 oz (60.442 kg)  BMI 23.61 kg/m2  SpO2 98%  BP/Weight 04/26/2015 04/19/2015 07/23/8880  Systolic BP 800 349 179  Diastolic BP 70 68 72  Wt. (Lbs) 133.25 132.5 141.8  BMI 23.61 23.48 25.13   Physical Exam  Constitutional:  Appears fatigued/mildly ill. NAD.   HENT:  Head: Normocephalic and atraumatic.  Mouth/Throat: Oropharynx is clear and moist.  Cardiovascular: Normal rate and regular rhythm.   Pulmonary/Chest: Effort normal.   Diffuse wheezing and coarse breath sounds throughout. Had some improvement with Duoneb.  Neurological: She is alert.  Vitals reviewed.  Lab Results  Component Value Date   WBC 5.9 08/16/2013   HGB 12.8 08/16/2013   HCT 38.0 08/16/2013   PLT 250.0 08/16/2013   GLUCOSE 94 08/16/2013   CHOL 217* 08/16/2013   TRIG 99.0 08/16/2013   HDL 56.40 08/16/2013   LDLCALC 141* 08/16/2013   ALT 18 08/16/2013   AST 21 08/16/2013   NA 138 08/16/2013   K 3.9 08/16/2013   CL 104 08/16/2013   CREATININE 0.8 08/16/2013   BUN 13 08/16/2013   CO2 25 08/16/2013   TSH 1.00 08/16/2013   Assessment & Plan:   Problem List Items Addressed This Visit    CAP (community acquired pneumonia) - Primary    Patient with recent CAP. No significant improvement. Given physical exam findings/clinical picture, will extend course of antibiotics for an additional 7 days. Giving an extended course of prednisone given significant wheezing on exam. Tussionex PRN.      Relevant Medications   moxifloxacin (AVELOX) 400 MG tablet   predniSONE (DELTASONE) 10 MG tablet   chlorpheniramine-HYDROcodone (TUSSIONEX PENNKINETIC ER) 10-8 MG/5ML SUER   Other Relevant Orders   CBC   Comp Met (CMET)      Meds ordered this encounter  Medications  .  moxifloxacin (AVELOX) 400 MG tablet    Sig: Take 1 tablet (400 mg total) by mouth daily.    Dispense:  7 tablet    Refill:  0  . predniSONE (DELTASONE) 10 MG tablet    Sig: 50 mg (5 tablets) daily x 3 days, then 40 mg (4 tablets) daily x 3 days, then 30 mg (3 tablets) daily x 3 days, then 20 mg (2 tablets) daily x 3 days, then 10 mg (1 tablet) daily x 3 days.    Dispense:  45 tablet    Refill:  0  . chlorpheniramine-HYDROcodone (TUSSIONEX PENNKINETIC ER) 10-8 MG/5ML SUER    Sig: Take 5 mLs by mouth every 12 (twelve) hours as needed.    Dispense:  115 mL    Refill:  0    Follow-up: Return in about 1 week (around 05/03/2015).  [place]

## 2015-05-10 ENCOUNTER — Other Ambulatory Visit: Payer: Self-pay | Admitting: Family Medicine

## 2015-05-10 ENCOUNTER — Ambulatory Visit (INDEPENDENT_AMBULATORY_CARE_PROVIDER_SITE_OTHER): Payer: Medicare Other | Admitting: Family Medicine

## 2015-05-10 ENCOUNTER — Telehealth: Payer: Self-pay | Admitting: Nurse Practitioner

## 2015-05-10 ENCOUNTER — Ambulatory Visit (INDEPENDENT_AMBULATORY_CARE_PROVIDER_SITE_OTHER)
Admission: RE | Admit: 2015-05-10 | Discharge: 2015-05-10 | Disposition: A | Discharge status: Home / Self Care | Payer: Medicare Other | Source: Ambulatory Visit | Attending: Family Medicine | Admitting: Family Medicine

## 2015-05-10 ENCOUNTER — Encounter: Payer: Self-pay | Admitting: Family Medicine

## 2015-05-10 VITALS — BP 132/70 | HR 88 | Temp 98.0°F | Ht 63.0 in | Wt 133.4 lb

## 2015-05-10 DIAGNOSIS — J189 Pneumonia, unspecified organism: Secondary | ICD-10-CM | POA: Diagnosis not present

## 2015-05-10 NOTE — Patient Instructions (Signed)
Get the chest xray.  We will call with the results.  Continue supportive care  Dr. Lacinda Axon

## 2015-05-10 NOTE — Telephone Encounter (Signed)
Called patient to let her know to get a x-ray at [place] before coming to her appointment so that we are able to double check on her previous diagnosis of pneumonia.

## 2015-05-11 NOTE — Progress Notes (Signed)
Subjective:  Patient ID: Audrey Cross, female    DOB: 05-19-1940  Age: 75 y.o. MRN: [id number]  CC:  Finished antibiotics and steroids and is not feeling much better  HPI:  75 year old female presents to clinic today with the above complaints.  Patient has been seen twice by me. Her first visit she was diagnosed with community acquired pneumonia. She was treated with Avelox. Patient followed up after completion antibiotic therapy and was still feeling poorly and had not had resolution of her symptoms. I extended her course of antibiotics at that time, and I also extended her course of prednisone given wheezing on exam.  Patient presents today reporting that she still feels poorly. She has had some improvement but does not yet feel like her normal self. She continues to have cough and fatigue. She states overall she just does not feel well. She has completed her antibiotic course. No recent fever. No chills.   Social Hx   Social History   Social History  . Marital Status: Married    Spouse Name: N/A  . Number of Children: 2  . Years of Education: N/A   Occupational History  . 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 Smoking status: Never Smoker   . Smokeless tobacco: Never Used  . Alcohol Use: No  . Drug Use: No  . Sexual Activity: Not Asked   Other Topics Concern  . None   Social History Narrative   Daughters, Judeen Hammans (born 33) and Suanne Marker (born 1964)   Review of Systems  Constitutional: Positive for fatigue. Negative for fever.  Respiratory: Positive for cough.    Objective:  BP 132/70 mmHg  Pulse 88  Temp(Src) 98 F (36.7 C) (Oral)  Ht 5\' 3"  (1.6 m)  Wt 133 lb 6 oz (60.499 kg)  BMI 23.63 kg/m2  SpO2 98%  BP/Weight 05/10/2015 04/26/2015 Q000111Q  Systolic BP Q000111Q [id number] Q000111Q  Diastolic BP 70 70 68  Wt. (Lbs) 133.38 133.25 132.5  BMI 23.63 23.61 23.48   Physical Exam  Constitutional: She is oriented to  person, place, and time.  General appearance improved from prior exams. She still does not appear to be well. NAD.  Cardiovascular: Normal rate and regular rhythm.   Pulmonary/Chest: Effort normal.  Coarse breath sounds and wheezing noted right greater than left.  Neurological: She is alert and oriented to person, place, and time.  Psychiatric: She has a normal mood and affect.  Vitals reviewed.  Lab Results  Component Value Date   WBC 10.9* 04/26/2015   HGB 12.8 04/26/2015   HCT 38.9 04/26/2015   PLT 295.0 04/26/2015   GLUCOSE 104* 04/26/2015   CHOL 217* 08/16/2013   TRIG 99.0 08/16/2013   HDL 56.40 08/16/2013   LDLCALC 141* 08/16/2013   ALT 16 04/26/2015   AST 13 04/26/2015   NA 139 04/26/2015   K 4.6 04/26/2015   CL 101 04/26/2015   CREATININE 0.65 04/26/2015   BUN 15 04/26/2015   CO2 29 04/26/2015   TSH 1.00 08/16/2013   Dg Chest 2 View  05/10/2015  CLINICAL DATA:  Community-acquired pneumonia diagnosed in January of 2017, persistent symptoms despite 2 courses of antibiotics. EXAM: CHEST  2 VIEW COMPARISON:  PA and lateral chest x-ray of April 19, 2015 FINDINGS: The lungs remain mildly hyperinflated. There has been interval improvement in the appearance of the left lower lobe infiltrate. Coarse  infrahilar lung markings persist bilaterally which may reflect underlying bronchiectasis. There is no alveolar infiltrate. There is no pleural effusion. There is stable apical pleural thickening bilaterally. The heart and pulmonary vascularity are normal. The mediastinum is normal in width. There is capsular calcification of bilateral breast implants. The bony thorax exhibits no acute abnormality. IMPRESSION: COPD vs versus chronic bronchitis. Improved appearance of the basilar lung markings especially on the left. There is residual increased density at both lung bases which may reflect residual infiltrate or bronchiectasis. If the patient is clinically improving, an additional follow-up  chest x-ray in 2-3 weeks would be useful. If the patient's symptoms are worsening, chest CT scanning now is recommended. Electronically Signed   By: David  Martinique M.D.   On: 05/10/2015 16:12   Dg Chest 2 View  04/19/2015  CLINICAL DATA:  Five days of fever and productive cough, history of cervical malignancy, nonsmoker. EXAM: CHEST  2 VIEW COMPARISON:  None in PACs FINDINGS: The lungs are well-expanded. There is are increased lung markings in the left lower lobe posteriorly. There is no pleural effusion. Capsular calcification of bilateral breast implants is noted. The heart and pulmonary vascularity are normal. The mediastinum is normal in width. There is no pleural effusion. IMPRESSION: Left lower lobe subsegmental atelectasis or early pneumonia. Followup PA and lateral chest X-ray is recommended in 3-4 weeks following trial of antibiotic therapy to ensure resolution and exclude underlying malignancy. Electronically Signed   By: David  Martinique M.D.   On: 04/19/2015 10:57    Assessment & Plan:   Problem List Items Addressed This Visit    CAP (community acquired pneumonia) - Primary    Established problem, minimal improvement. Patient has had some improvement but still continues to not feel well. Given lack of resolution and exam findings, repeat chest x-ray was performed. I independently reviewed the film. Interpretation: Bilateral densities in the lower lobes. There has been improvement regarding LLL infiltrate but not completely resolved.  At this point, I'm unsure why she has not had complete resolution. This could be continued infiltrate or possibly bronchiectasis. Obtaining CT chest for evaluation.  May need to see Pulm. Patient to continue albuterol as needed. Holding off on further antibiotics and steroids at this time.      Relevant Orders   CT Chest Wo Contrast     Follow-up: Following CT chest  [place]

## 2015-05-11 NOTE — Assessment & Plan Note (Signed)
Established problem, minimal improvement. Patient has had some improvement but still continues to not feel well. Given lack of resolution and exam findings, repeat chest x-ray was performed. I independently reviewed the film. Interpretation: Bilateral densities in the lower lobes. There has been improvement regarding LLL infiltrate but not completely resolved.  At this point, I'm unsure why she has not had complete resolution. This could be continued infiltrate or possibly bronchiectasis. Obtaining CT chest for evaluation.  May need to see Pulm. Patient to continue albuterol as needed. Holding off on further antibiotics and steroids at this time.

## 2015-05-13 ENCOUNTER — Other Ambulatory Visit: Payer: Self-pay | Admitting: Nurse Practitioner

## 2015-05-13 DIAGNOSIS — R059 Cough, unspecified: Secondary | ICD-10-CM

## 2015-05-13 DIAGNOSIS — R05 Cough: Secondary | ICD-10-CM

## 2015-05-13 DIAGNOSIS — J189 Pneumonia, unspecified organism: Secondary | ICD-10-CM

## 2015-05-16 ENCOUNTER — Ambulatory Visit
Admission: RE | Admit: 2015-05-16 | Discharge: 2015-05-16 | Disposition: A | Discharge status: Home / Self Care | Payer: Medicare Other | Source: Ambulatory Visit | Attending: Family Medicine | Admitting: Family Medicine

## 2015-05-16 DIAGNOSIS — J189 Pneumonia, unspecified organism: Secondary | ICD-10-CM | POA: Insufficient documentation

## 2015-05-16 DIAGNOSIS — R918 Other nonspecific abnormal finding of lung field: Secondary | ICD-10-CM | POA: Diagnosis not present

## 2015-05-27 DIAGNOSIS — M5136 Other intervertebral disc degeneration, lumbar region: Secondary | ICD-10-CM | POA: Diagnosis not present

## 2015-05-27 DIAGNOSIS — M5134 Other intervertebral disc degeneration, thoracic region: Secondary | ICD-10-CM | POA: Diagnosis not present

## 2015-05-27 DIAGNOSIS — M542 Cervicalgia: Secondary | ICD-10-CM | POA: Diagnosis not present

## 2015-05-27 DIAGNOSIS — M9903 Segmental and somatic dysfunction of lumbar region: Secondary | ICD-10-CM | POA: Diagnosis not present

## 2015-05-27 DIAGNOSIS — M9901 Segmental and somatic dysfunction of cervical region: Secondary | ICD-10-CM | POA: Diagnosis not present

## 2015-05-27 DIAGNOSIS — M5030 Other cervical disc degeneration, unspecified cervical region: Secondary | ICD-10-CM | POA: Diagnosis not present

## 2015-05-27 DIAGNOSIS — M545 Low back pain: Secondary | ICD-10-CM | POA: Diagnosis not present

## 2015-05-27 DIAGNOSIS — M9902 Segmental and somatic dysfunction of thoracic region: Secondary | ICD-10-CM | POA: Diagnosis not present

## 2015-05-27 DIAGNOSIS — M546 Pain in thoracic spine: Secondary | ICD-10-CM | POA: Diagnosis not present

## 2015-05-29 DIAGNOSIS — M5136 Other intervertebral disc degeneration, lumbar region: Secondary | ICD-10-CM | POA: Diagnosis not present

## 2015-05-29 DIAGNOSIS — M5134 Other intervertebral disc degeneration, thoracic region: Secondary | ICD-10-CM | POA: Diagnosis not present

## 2015-05-29 DIAGNOSIS — M9902 Segmental and somatic dysfunction of thoracic region: Secondary | ICD-10-CM | POA: Diagnosis not present

## 2015-05-29 DIAGNOSIS — M546 Pain in thoracic spine: Secondary | ICD-10-CM | POA: Diagnosis not present

## 2015-05-29 DIAGNOSIS — M542 Cervicalgia: Secondary | ICD-10-CM | POA: Diagnosis not present

## 2015-05-29 DIAGNOSIS — M9903 Segmental and somatic dysfunction of lumbar region: Secondary | ICD-10-CM | POA: Diagnosis not present

## 2015-05-29 DIAGNOSIS — M9901 Segmental and somatic dysfunction of cervical region: Secondary | ICD-10-CM | POA: Diagnosis not present

## 2015-05-29 DIAGNOSIS — M545 Low back pain: Secondary | ICD-10-CM | POA: Diagnosis not present

## 2015-05-29 DIAGNOSIS — M5030 Other cervical disc degeneration, unspecified cervical region: Secondary | ICD-10-CM | POA: Diagnosis not present

## 2015-05-31 DIAGNOSIS — M5136 Other intervertebral disc degeneration, lumbar region: Secondary | ICD-10-CM | POA: Diagnosis not present

## 2015-05-31 DIAGNOSIS — M9902 Segmental and somatic dysfunction of thoracic region: Secondary | ICD-10-CM | POA: Diagnosis not present

## 2015-05-31 DIAGNOSIS — M9903 Segmental and somatic dysfunction of lumbar region: Secondary | ICD-10-CM | POA: Diagnosis not present

## 2015-05-31 DIAGNOSIS — M542 Cervicalgia: Secondary | ICD-10-CM | POA: Diagnosis not present

## 2015-05-31 DIAGNOSIS — M5030 Other cervical disc degeneration, unspecified cervical region: Secondary | ICD-10-CM | POA: Diagnosis not present

## 2015-05-31 DIAGNOSIS — M9901 Segmental and somatic dysfunction of cervical region: Secondary | ICD-10-CM | POA: Diagnosis not present

## 2015-05-31 DIAGNOSIS — M5134 Other intervertebral disc degeneration, thoracic region: Secondary | ICD-10-CM | POA: Diagnosis not present

## 2015-05-31 DIAGNOSIS — M546 Pain in thoracic spine: Secondary | ICD-10-CM | POA: Diagnosis not present

## 2015-05-31 DIAGNOSIS — M545 Low back pain: Secondary | ICD-10-CM | POA: Diagnosis not present

## 2015-06-03 DIAGNOSIS — M9901 Segmental and somatic dysfunction of cervical region: Secondary | ICD-10-CM | POA: Diagnosis not present

## 2015-06-03 DIAGNOSIS — M9902 Segmental and somatic dysfunction of thoracic region: Secondary | ICD-10-CM | POA: Diagnosis not present

## 2015-06-03 DIAGNOSIS — M545 Low back pain: Secondary | ICD-10-CM | POA: Diagnosis not present

## 2015-06-03 DIAGNOSIS — M5136 Other intervertebral disc degeneration, lumbar region: Secondary | ICD-10-CM | POA: Diagnosis not present

## 2015-06-03 DIAGNOSIS — M542 Cervicalgia: Secondary | ICD-10-CM | POA: Diagnosis not present

## 2015-06-03 DIAGNOSIS — M5030 Other cervical disc degeneration, unspecified cervical region: Secondary | ICD-10-CM | POA: Diagnosis not present

## 2015-06-03 DIAGNOSIS — M546 Pain in thoracic spine: Secondary | ICD-10-CM | POA: Diagnosis not present

## 2015-06-03 DIAGNOSIS — M5134 Other intervertebral disc degeneration, thoracic region: Secondary | ICD-10-CM | POA: Diagnosis not present

## 2015-06-03 DIAGNOSIS — M9903 Segmental and somatic dysfunction of lumbar region: Secondary | ICD-10-CM | POA: Diagnosis not present

## 2015-06-07 DIAGNOSIS — M5134 Other intervertebral disc degeneration, thoracic region: Secondary | ICD-10-CM | POA: Diagnosis not present

## 2015-06-07 DIAGNOSIS — M5030 Other cervical disc degeneration, unspecified cervical region: Secondary | ICD-10-CM | POA: Diagnosis not present

## 2015-06-07 DIAGNOSIS — M5136 Other intervertebral disc degeneration, lumbar region: Secondary | ICD-10-CM | POA: Diagnosis not present

## 2015-06-07 DIAGNOSIS — M542 Cervicalgia: Secondary | ICD-10-CM | POA: Diagnosis not present

## 2015-06-07 DIAGNOSIS — M546 Pain in thoracic spine: Secondary | ICD-10-CM | POA: Diagnosis not present

## 2015-06-07 DIAGNOSIS — M9903 Segmental and somatic dysfunction of lumbar region: Secondary | ICD-10-CM | POA: Diagnosis not present

## 2015-06-07 DIAGNOSIS — M9902 Segmental and somatic dysfunction of thoracic region: Secondary | ICD-10-CM | POA: Diagnosis not present

## 2015-06-07 DIAGNOSIS — M9901 Segmental and somatic dysfunction of cervical region: Secondary | ICD-10-CM | POA: Diagnosis not present

## 2015-06-07 DIAGNOSIS — M545 Low back pain: Secondary | ICD-10-CM | POA: Diagnosis not present

## 2015-06-10 DIAGNOSIS — M5030 Other cervical disc degeneration, unspecified cervical region: Secondary | ICD-10-CM | POA: Diagnosis not present

## 2015-06-10 DIAGNOSIS — M9901 Segmental and somatic dysfunction of cervical region: Secondary | ICD-10-CM | POA: Diagnosis not present

## 2015-06-10 DIAGNOSIS — M5136 Other intervertebral disc degeneration, lumbar region: Secondary | ICD-10-CM | POA: Diagnosis not present

## 2015-06-10 DIAGNOSIS — M9903 Segmental and somatic dysfunction of lumbar region: Secondary | ICD-10-CM | POA: Diagnosis not present

## 2015-06-10 DIAGNOSIS — M9902 Segmental and somatic dysfunction of thoracic region: Secondary | ICD-10-CM | POA: Diagnosis not present

## 2015-06-10 DIAGNOSIS — M542 Cervicalgia: Secondary | ICD-10-CM | POA: Diagnosis not present

## 2015-06-10 DIAGNOSIS — M546 Pain in thoracic spine: Secondary | ICD-10-CM | POA: Diagnosis not present

## 2015-06-10 DIAGNOSIS — M545 Low back pain: Secondary | ICD-10-CM | POA: Diagnosis not present

## 2015-06-10 DIAGNOSIS — M5134 Other intervertebral disc degeneration, thoracic region: Secondary | ICD-10-CM | POA: Diagnosis not present

## 2015-06-12 DIAGNOSIS — M9902 Segmental and somatic dysfunction of thoracic region: Secondary | ICD-10-CM | POA: Diagnosis not present

## 2015-06-12 DIAGNOSIS — M545 Low back pain: Secondary | ICD-10-CM | POA: Diagnosis not present

## 2015-06-12 DIAGNOSIS — M5030 Other cervical disc degeneration, unspecified cervical region: Secondary | ICD-10-CM | POA: Diagnosis not present

## 2015-06-12 DIAGNOSIS — M542 Cervicalgia: Secondary | ICD-10-CM | POA: Diagnosis not present

## 2015-06-12 DIAGNOSIS — M546 Pain in thoracic spine: Secondary | ICD-10-CM | POA: Diagnosis not present

## 2015-06-12 DIAGNOSIS — M9903 Segmental and somatic dysfunction of lumbar region: Secondary | ICD-10-CM | POA: Diagnosis not present

## 2015-06-12 DIAGNOSIS — M5136 Other intervertebral disc degeneration, lumbar region: Secondary | ICD-10-CM | POA: Diagnosis not present

## 2015-06-12 DIAGNOSIS — M9901 Segmental and somatic dysfunction of cervical region: Secondary | ICD-10-CM | POA: Diagnosis not present

## 2015-06-12 DIAGNOSIS — M5134 Other intervertebral disc degeneration, thoracic region: Secondary | ICD-10-CM | POA: Diagnosis not present

## 2015-06-14 DIAGNOSIS — M545 Low back pain: Secondary | ICD-10-CM | POA: Diagnosis not present

## 2015-06-14 DIAGNOSIS — M546 Pain in thoracic spine: Secondary | ICD-10-CM | POA: Diagnosis not present

## 2015-06-14 DIAGNOSIS — M5030 Other cervical disc degeneration, unspecified cervical region: Secondary | ICD-10-CM | POA: Diagnosis not present

## 2015-06-14 DIAGNOSIS — M9903 Segmental and somatic dysfunction of lumbar region: Secondary | ICD-10-CM | POA: Diagnosis not present

## 2015-06-14 DIAGNOSIS — M542 Cervicalgia: Secondary | ICD-10-CM | POA: Diagnosis not present

## 2015-06-14 DIAGNOSIS — M5134 Other intervertebral disc degeneration, thoracic region: Secondary | ICD-10-CM | POA: Diagnosis not present

## 2015-06-14 DIAGNOSIS — M5136 Other intervertebral disc degeneration, lumbar region: Secondary | ICD-10-CM | POA: Diagnosis not present

## 2015-06-14 DIAGNOSIS — M9901 Segmental and somatic dysfunction of cervical region: Secondary | ICD-10-CM | POA: Diagnosis not present

## 2015-06-14 DIAGNOSIS — M9902 Segmental and somatic dysfunction of thoracic region: Secondary | ICD-10-CM | POA: Diagnosis not present

## 2015-06-17 DIAGNOSIS — M9902 Segmental and somatic dysfunction of thoracic region: Secondary | ICD-10-CM | POA: Diagnosis not present

## 2015-06-17 DIAGNOSIS — M545 Low back pain: Secondary | ICD-10-CM | POA: Diagnosis not present

## 2015-06-17 DIAGNOSIS — M9901 Segmental and somatic dysfunction of cervical region: Secondary | ICD-10-CM | POA: Diagnosis not present

## 2015-06-17 DIAGNOSIS — M5136 Other intervertebral disc degeneration, lumbar region: Secondary | ICD-10-CM | POA: Diagnosis not present

## 2015-06-17 DIAGNOSIS — M5030 Other cervical disc degeneration, unspecified cervical region: Secondary | ICD-10-CM | POA: Diagnosis not present

## 2015-06-17 DIAGNOSIS — M542 Cervicalgia: Secondary | ICD-10-CM | POA: Diagnosis not present

## 2015-06-17 DIAGNOSIS — M5134 Other intervertebral disc degeneration, thoracic region: Secondary | ICD-10-CM | POA: Diagnosis not present

## 2015-06-17 DIAGNOSIS — M546 Pain in thoracic spine: Secondary | ICD-10-CM | POA: Diagnosis not present

## 2015-06-17 DIAGNOSIS — M9903 Segmental and somatic dysfunction of lumbar region: Secondary | ICD-10-CM | POA: Diagnosis not present

## 2015-06-21 ENCOUNTER — Ambulatory Visit (INDEPENDENT_AMBULATORY_CARE_PROVIDER_SITE_OTHER): Payer: Medicare Other | Admitting: Internal Medicine

## 2015-06-21 ENCOUNTER — Encounter: Payer: Self-pay | Admitting: Internal Medicine

## 2015-06-21 VITALS — BP 130/68 | HR 90 | Ht 64.0 in | Wt 134.2 lb

## 2015-06-21 DIAGNOSIS — R911 Solitary pulmonary nodule: Secondary | ICD-10-CM

## 2015-06-21 NOTE — Patient Instructions (Addendum)
--  Continue to increase your activity level.   --Repeat CT chest in 6 months, follow up after that.
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
## 2015-06-24 DIAGNOSIS — M5136 Other intervertebral disc degeneration, lumbar region: Secondary | ICD-10-CM | POA: Diagnosis not present

## 2015-06-24 DIAGNOSIS — M542 Cervicalgia: Secondary | ICD-10-CM | POA: Diagnosis not present

## 2015-06-24 DIAGNOSIS — M546 Pain in thoracic spine: Secondary | ICD-10-CM | POA: Diagnosis not present

## 2015-06-24 DIAGNOSIS — M9901 Segmental and somatic dysfunction of cervical region: Secondary | ICD-10-CM | POA: Diagnosis not present

## 2015-06-24 DIAGNOSIS — M5134 Other intervertebral disc degeneration, thoracic region: Secondary | ICD-10-CM | POA: Diagnosis not present

## 2015-06-24 DIAGNOSIS — M9902 Segmental and somatic dysfunction of thoracic region: Secondary | ICD-10-CM | POA: Diagnosis not present

## 2015-06-24 DIAGNOSIS — M5030 Other cervical disc degeneration, unspecified cervical region: Secondary | ICD-10-CM | POA: Diagnosis not present

## 2015-06-24 DIAGNOSIS — M9903 Segmental and somatic dysfunction of lumbar region: Secondary | ICD-10-CM | POA: Diagnosis not present

## 2015-06-24 DIAGNOSIS — M545 Low back pain: Secondary | ICD-10-CM | POA: Diagnosis not present

## 2015-07-01 DIAGNOSIS — M5136 Other intervertebral disc degeneration, lumbar region: Secondary | ICD-10-CM | POA: Diagnosis not present

## 2015-07-01 DIAGNOSIS — M9903 Segmental and somatic dysfunction of lumbar region: Secondary | ICD-10-CM | POA: Diagnosis not present

## 2015-07-01 DIAGNOSIS — M542 Cervicalgia: Secondary | ICD-10-CM | POA: Diagnosis not present

## 2015-07-01 DIAGNOSIS — M5134 Other intervertebral disc degeneration, thoracic region: Secondary | ICD-10-CM | POA: Diagnosis not present

## 2015-07-01 DIAGNOSIS — M545 Low back pain: Secondary | ICD-10-CM | POA: Diagnosis not present

## 2015-07-01 DIAGNOSIS — M5030 Other cervical disc degeneration, unspecified cervical region: Secondary | ICD-10-CM | POA: Diagnosis not present

## 2015-07-01 DIAGNOSIS — M9902 Segmental and somatic dysfunction of thoracic region: Secondary | ICD-10-CM | POA: Diagnosis not present

## 2015-07-01 DIAGNOSIS — M9901 Segmental and somatic dysfunction of cervical region: Secondary | ICD-10-CM | POA: Diagnosis not present

## 2015-07-01 DIAGNOSIS — M546 Pain in thoracic spine: Secondary | ICD-10-CM | POA: Diagnosis not present

## 2015-07-19 DIAGNOSIS — M5136 Other intervertebral disc degeneration, lumbar region: Secondary | ICD-10-CM | POA: Diagnosis not present

## 2015-07-19 DIAGNOSIS — M9901 Segmental and somatic dysfunction of cervical region: Secondary | ICD-10-CM | POA: Diagnosis not present

## 2015-07-19 DIAGNOSIS — M5030 Other cervical disc degeneration, unspecified cervical region: Secondary | ICD-10-CM | POA: Diagnosis not present

## 2015-07-19 DIAGNOSIS — M545 Low back pain: Secondary | ICD-10-CM | POA: Diagnosis not present

## 2015-07-19 DIAGNOSIS — M546 Pain in thoracic spine: Secondary | ICD-10-CM | POA: Diagnosis not present

## 2015-07-19 DIAGNOSIS — M5134 Other intervertebral disc degeneration, thoracic region: Secondary | ICD-10-CM | POA: Diagnosis not present

## 2015-07-19 DIAGNOSIS — M542 Cervicalgia: Secondary | ICD-10-CM | POA: Diagnosis not present

## 2015-07-19 DIAGNOSIS — M9902 Segmental and somatic dysfunction of thoracic region: Secondary | ICD-10-CM | POA: Diagnosis not present

## 2015-07-19 DIAGNOSIS — M9903 Segmental and somatic dysfunction of lumbar region: Secondary | ICD-10-CM | POA: Diagnosis not present

## 2015-08-22 ENCOUNTER — Ambulatory Visit (INDEPENDENT_AMBULATORY_CARE_PROVIDER_SITE_OTHER): Payer: Medicare Other | Admitting: Family Medicine

## 2015-08-22 ENCOUNTER — Encounter: Payer: Self-pay | Admitting: Family Medicine

## 2015-08-22 ENCOUNTER — Encounter: Payer: Medicare Other | Admitting: Nurse Practitioner

## 2015-08-22 VITALS — BP 114/70 | HR 76 | Temp 97.7°F | Ht 64.0 in | Wt 133.0 lb

## 2015-08-22 DIAGNOSIS — Z0000 Encounter for general adult medical examination without abnormal findings: Secondary | ICD-10-CM

## 2015-08-22 DIAGNOSIS — R918 Other nonspecific abnormal finding of lung field: Secondary | ICD-10-CM | POA: Insufficient documentation

## 2015-08-22 DIAGNOSIS — Z1239 Encounter for other screening for malignant neoplasm of breast: Secondary | ICD-10-CM | POA: Diagnosis not present

## 2015-08-22 DIAGNOSIS — Z23 Encounter for immunization: Secondary | ICD-10-CM | POA: Diagnosis not present

## 2015-08-22 DIAGNOSIS — E2839 Other primary ovarian failure: Secondary | ICD-10-CM

## 2015-08-22 MED ORDER — ZOSTER VACCINE LIVE 19400 UNT/0.65ML ~~LOC~~ SUSR: 0.6500 mL | Freq: Once | SUBCUTANEOUS | Status: DC

## 2015-08-22 NOTE — Progress Notes (Signed)
Pre visit review using our clinic review tool, if applicable. No additional management support is needed unless otherwise documented below in the visit note. 

## 2015-08-22 NOTE — Progress Notes (Signed)
Subjective:    Audrey Cross is a 75 y.o. female who presents for Medicare Annual/Subsequent preventive examination.  Preventive Screening-Counseling & Management  Tobacco History  Smoking status  . Never Smoker   Smokeless tobacco  . Never Used    Current Problems (verified) Patient Active Problem List   Diagnosis Date Noted  . CAP (community acquired pneumonia) 04/19/2015  . Knee pain, left 06/23/2014  . Medicare annual wellness visit, subsequent 08/16/2013  . Screening for osteoporosis 08/16/2013  . Screening for breast cancer 08/16/2013  . HLD (hyperlipidemia) 08/16/2013  . Encounter for Papanicolaou smear of cervix in high-risk patient with prior abnormal result 08/16/2013  . 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 Migraine 07/29/2013   Medications Prior to Visit Current Outpatient Prescriptions on File Prior to Visit  Medication Sig Dispense Refill  . butalbital-acetaminophen-caffeine (FIORICET) 50-325-40 MG per tablet Take 1 tablet by mouth 2 (two) times daily as needed for headache. 30 tablet 2  . glucosamine-chondroitin 500-400 MG tablet Take 1 tablet by mouth daily.    Marland Kitchen ketoprofen (ORUDIS) 75 MG capsule Take 1 capsule (75 mg total) by mouth 3 (three) times daily. 90 capsule 0  . lisinopril (PRINIVIL,ZESTRIL) 10 MG tablet TAKE 1 TABLET (10 MG TOTAL) BY MOUTH DAILY. 90 tablet 1  . Multiple Vitamin (MULTIVITAMIN) capsule Take 1 capsule by mouth daily.    . valACYclovir (VALTREX) 500 MG tablet TAKE 1 TABLET BY MOUTH EVERY DAY 30 tablet 5  . vitamin B-12 (CYANOCOBALAMIN) 1000 MCG tablet Take 1,000 mcg by mouth daily. Reported on 05/10/2015    . zoster vaccine live, PF, (ZOSTAVAX) 57846 UNT/0.65ML injection Inject 19,400 Units into the skin once. 1 each 0   No current facility-administered medications on file prior to visit.   Current Medications (verified) Current Outpatient Prescriptions  Medication Sig Dispense Refill  . butalbital-acetaminophen-caffeine (FIORICET) 50-325-40  MG per tablet Take 1 tablet by mouth 2 (two) times daily as needed for headache. 30 tablet 2  . glucosamine-chondroitin 500-400 MG tablet Take 1 tablet by mouth daily.    Marland Kitchen ketoprofen (ORUDIS) 75 MG capsule Take 1 capsule (75 mg total) by mouth 3 (three) times daily. 90 capsule 0  . lisinopril (PRINIVIL,ZESTRIL) 10 MG tablet TAKE 1 TABLET (10 MG TOTAL) BY MOUTH DAILY. 90 tablet 1  . Multiple Vitamin (MULTIVITAMIN) capsule Take 1 capsule by mouth daily.    . valACYclovir (VALTREX) 500 MG tablet TAKE 1 TABLET BY MOUTH EVERY DAY 30 tablet 5  . vitamin B-12 (CYANOCOBALAMIN) 1000 MCG tablet Take 1,000 mcg by mouth daily. Reported on 05/10/2015    . zoster vaccine live, PF, (ZOSTAVAX) 96295 UNT/0.65ML injection Inject 19,400 Units into the skin once. 1 each 0   No current facility-administered medications for this visit.    Allergies (verified) Review of patient's allergies indicates no known allergies.   PAST HISTORY  Family History Family History  Problem Relation Age of Onset  . Breast cancer Mother   . Stroke Mother   . Stroke Father   . Heart disease Father   . Breast cancer Daughter    Social History Social History  Substance Use Topics  . Smoking status: Never Smoker   . Smokeless tobacco: Never Used  . Alcohol Use: No    Are there smokers in your home (other than you)? No  Risk Factors Current exercise habits: Mowing the lawn currently and trying to be active.  Cardiac risk factors: advanced age (older than 73 for men, 76 for women), dyslipidemia and  hypertension.  Depression Screen (Note: if answer to either of the following is "Yes", a more complete depression screening is indicated)   Over the past two weeks, have you felt down, depressed or hopeless? No  Over the past two weeks, have you felt little interest or pleasure in doing things? No  Have you lost interest or pleasure in daily life? No  Do you often feel hopeless? No  Do you cry easily over simple problems?  No  Activities of Daily Living In your present state of health, do you have any difficulty performing the following activities?:  Driving? No Managing money?  No. Feeding yourself? No. Getting from bed to chair? No. Climbing a flight of stairs? No Preparing food and eating?: No Bathing or showering? No Getting dressed: No Getting to the toilet? No Using the toilet:No Moving around from place to place: No In the past year have you fallen or had a near fall?:Yes; recently tripped on something on the floor.   Are you sexually active?  No  Do you have more than one partner?  No  Hearing Difficulties: No Do you often ask people to speak up or repeat themselves? No Do you experience ringing or noises in your ears? Yes Do you have difficulty understanding soft or whispered voices? No   Do you feel that you have a problem with memory? No  Do you often misplace items? No  Do you feel safe at home?  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  Alert? Yes  Normal Appearance?Yes  Oriented to person? Yes  Place? Yes   Time? Yes  Recall of three objects?  Yes  Can perform simple calculations? Yes  Displays appropriate judgment?Yes  Can read the correct time from a watch face?Yes   Advanced Directives have been discussed with the patient? Yes; Has these in place.   List the Names of Other Physician/Practitioners you currently use:  Dr. Ashby Dawes - Pulmonology.  Indicate any recent Medical Services you may have received from other than [facility] providers in the past year (date may be approximate).  Immunization History  Administered Date(s) Administered  . Influenza Split 03/26/2014  . Pneumococcal Conjugate-13 08/16/2013   Screening Tests Health Maintenance  Topic Date Due  . ZOSTAVAX  10/25/1999  . COLONOSCOPY  04/30/2012  . PNA vac Low Risk Adult (2 of 2 - PPSV23) 08/17/2014  . INFLUENZA VACCINE  10/22/2015  . TETANUS/TDAP  03/23/2017  . DEXA SCAN   Completed   All answers were reviewed with the patient and necessary referrals were made:  Coral Spikes, DO   08/22/2015   History reviewed: allergies, current medications, past family history, past medical history, past social history, past surgical history and problem list  Review of Systems Occasional headaches. Remainder of ROS negative.  Objective:   Body mass index is 22.82 kg/(m^2). BP 114/70 mmHg  Pulse 76  Temp(Src) 97.7 F (36.5 C) (Oral)  Ht 5\' 4"  (1.626 m)  Wt 133 lb (60.328 kg)  BMI 22.82 kg/m2  SpO2 97%  General: well appearing female in NAD. HENT: NCAT. Oropharynx clear. Normal TMs bilaterally. Eyes: PERRLA. EOMI. Normal conjunctiva. Neck: Supple. No thyromegaly or adenopathy. CV: Regular rate and rhythm. No murmur, rub, or gallop. No LE edema. Resp: Normal effort. No increased work of breathing. Lungs CTAB. No rales, rhonchi, wheezing. Abdomen: Soft, nondistended, nontender. No palpable mass. MSK: Normal ROM. Neuro: AO x 3. No focal deficits. Psych: Normal mood and affect. Skin:  No rash.   Assessment:  Medicare wellness visit.  Plan:     During the course of the visit the patient was educated and counseled about appropriate screening and preventive services including:    Pneumococcal vaccine   Influenza vaccine  Td vaccine  Screening mammography  Bone densitometry screening  Colorectal cancer screening  Diabetes screening  Glaucoma screening  Advanced directives: Patient has advanced directive.  Pneumovax given today. Pap smear no longer needed. Will schedule mammogram. Has regular eye exams. Declines colonoscopy. Will arrange Dexa scan.  Patient Instructions (the written plan) was given to the patient.  Medicare Attestation I have personally reviewed: The patient's medical and social history Their use of alcohol, tobacco or illicit drugs Their current medications and supplements The patient's functional ability including  ADLs,fall risks, home safety risks, cognitive, and hearing and visual impairment Diet and physical activities Evidence for depression or mood disorders  The patient's weight, height, BMI, and visual acuity have been recorded in the chart.  I have made referrals, counseling, and provided education to the patient based on review of the above and I have provided the patient with a written personalized care plan for preventive services.     Coral Spikes, DO   08/22/2015

## 2015-08-22 NOTE — Patient Instructions (Signed)
Continue your current medications.  We will call regarding your mammogram and dexa scan.  Get the shingles shot at your pharmacy.  Follow up in 6 months.  Take care  Dr. Lacinda Axon   Health Maintenance, Female Adopting a healthy lifestyle and getting preventive care can go a long way to promote health and wellness. Talk with your health care provider about what schedule of regular examinations is right for you. This is a good chance for you to check in with your provider about disease prevention and staying healthy. In between checkups, there are plenty of things you can do on your own. Experts have done a lot of research about which lifestyle changes and preventive measures are most likely to keep you healthy. Ask your health care provider for more information. WEIGHT AND DIET  Eat a healthy diet  Be sure to include plenty of vegetables, fruits, low-fat dairy products, and lean protein.  Do not eat a lot of foods high in solid fats, added sugars, or salt.  Get regular exercise. This is one of the most important things you can do for your health.  Most adults should exercise for at least 150 minutes each week. The exercise should increase your heart rate and make you sweat (moderate-intensity exercise).  Most adults should also do strengthening exercises at least twice a week. This is in addition to the moderate-intensity exercise.  Maintain a healthy weight  Body mass index (BMI) is a measurement that can be used to identify possible weight problems. It estimates body fat based on height and weight. Your health care provider can help determine your BMI and help you achieve or maintain a healthy weight.  For females 76 years of age and older:   A BMI below 18.5 is considered underweight.  A BMI of 18.5 to 24.9 is normal.  A BMI of 25 to 29.9 is considered overweight.  A BMI of 30 and above is considered obese.  Watch levels of cholesterol and blood lipids  You should start  having your blood tested for lipids and cholesterol at 76 years of age, then have this test every 5 years.  You may need to have your cholesterol levels checked more often if:  Your lipid or cholesterol levels are high.  You are older than 76 years of age.  You are at high risk for heart disease.  CANCER SCREENING   Lung Cancer  Lung cancer screening is recommended for adults 80-68 years old who are at high risk for lung cancer because of a history of smoking.  A yearly low-dose CT scan of the lungs is recommended for people who:  Currently smoke.  Have quit within the past 15 years.  Have at least a 30-pack-year history of smoking. A pack year is smoking an average of one pack of cigarettes a day for 1 year.  Yearly screening should continue until it has been 15 years since you quit.  Yearly screening should stop if you develop a health problem that would prevent you from having lung cancer treatment.  Breast Cancer  Practice breast self-awareness. This means understanding how your breasts normally appear and feel.  It also means doing regular breast self-exams. Let your health care provider know about any changes, no matter how small.  If you are in your 20s or 30s, you should have a clinical breast exam (CBE) by a health care provider every 1-3 years as part of a regular health exam.  If you are 40 or older,  have a CBE every year. Also consider having a breast X-ray (mammogram) every year.  If you have a family history of breast cancer, talk to your health care provider about genetic screening.  If you are at high risk for breast cancer, talk to your health care provider about having an MRI and a mammogram every year.  Breast cancer gene (BRCA) assessment is recommended for women who have family members with BRCA-related cancers. BRCA-related cancers include:  Breast.  Ovarian.  Tubal.  Peritoneal cancers.  Results of the assessment will determine the need for  genetic counseling and BRCA1 and BRCA2 testing. Cervical Cancer Your health care provider may recommend that you be screened regularly for cancer of the pelvic organs (ovaries, uterus, and vagina). This screening involves a pelvic examination, including checking for microscopic changes to the surface of your cervix (Pap test). You may be encouraged to have this screening done every 3 years, beginning at age 40.  For women ages 23-65, health care providers may recommend pelvic exams and Pap testing every 3 years, or they may recommend the Pap and pelvic exam, combined with testing for human papilloma virus (HPV), every 5 years. Some types of HPV increase your risk of cervical cancer. Testing for HPV may also be done on women of any age with unclear Pap test results.  Other health care providers may not recommend any screening for nonpregnant women who are considered low risk for pelvic cancer and who do not have symptoms. Ask your health care provider if a screening pelvic exam is right for you.  If you have had past treatment for cervical cancer or a condition that could lead to cancer, you need Pap tests and screening for cancer for at least 20 years after your treatment. If Pap tests have been discontinued, your risk factors (such as having a new sexual partner) need to be reassessed to determine if screening should resume. Some women have medical problems that increase the chance of getting cervical cancer. In these cases, your health care provider may recommend more frequent screening and Pap tests. Colorectal Cancer  This type of cancer can be detected and often prevented.  Routine colorectal cancer screening usually begins at 76 years of age and continues through 76 years of age.  Your health care provider may recommend screening at an earlier age if you have risk factors for colon cancer.  Your health care provider may also recommend using home test kits to check for hidden blood in the  stool.  A small camera at the end of a tube can be used to examine your colon directly (sigmoidoscopy or colonoscopy). This is done to check for the earliest forms of colorectal cancer.  Routine screening usually begins at age 49.  Direct examination of the colon should be repeated every 5-10 years through 76 years of age. However, you may need to be screened more often if early forms of precancerous polyps or small growths are found. Skin Cancer  Check your skin from head to toe regularly.  Tell your health care provider about any new moles or changes in moles, especially if there is a change in a mole's shape or color.  Also tell your health care provider if you have a mole that is larger than the size of a pencil eraser.  Always use sunscreen. Apply sunscreen liberally and repeatedly throughout the day.  Protect yourself by wearing long sleeves, pants, a wide-brimmed hat, and sunglasses whenever you are outside. HEART DISEASE, DIABETES, AND  HIGH BLOOD PRESSURE   High blood pressure causes heart disease and increases the risk of stroke. High blood pressure is more likely to develop in:  People who have blood pressure in the high end of the normal range (130-139/85-89 mm Hg).  People who are overweight or obese.  People who are African American.  If you are 82-58 years of age, have your blood pressure checked every 3-5 years. If you are 8 years of age or older, have your blood pressure checked every year. You should have your blood pressure measured twice--once when you are at a hospital or clinic, and once when you are not at a hospital or clinic. Record the average of the two measurements. To check your blood pressure when you are not at a hospital or clinic, you can use:  An automated blood pressure machine at a pharmacy.  A home blood pressure monitor.  If you are between 75 years and 23 years old, ask your health care provider if you should take aspirin to prevent  strokes.  Have regular diabetes screenings. This involves taking a blood sample to check your fasting blood sugar level.  If you are at a normal weight and have a low risk for diabetes, have this test once every three years after 76 years of age.  If you are overweight and have a high risk for diabetes, consider being tested at a younger age or more often. PREVENTING INFECTION  Hepatitis B  If you have a higher risk for hepatitis B, you should be screened for this virus. You are considered at high risk for hepatitis B if:  You were born in a country where hepatitis B is common. Ask your health care provider which countries are considered high risk.  Your parents were born in a high-risk country, and you have not been immunized against hepatitis B (hepatitis B vaccine).  You have HIV or AIDS.  You use needles to inject street drugs.  You live with someone who has hepatitis B.  You have had sex with someone who has hepatitis B.  You get hemodialysis treatment.  You take certain medicines for conditions, including cancer, organ transplantation, and autoimmune conditions. Hepatitis C  Blood testing is recommended for:  Everyone born from [birth year] through 1966.  Anyone with known risk factors for hepatitis C. Sexually transmitted infections (STIs)  You should be screened for sexually transmitted infections (STIs) including gonorrhea and chlamydia if:  You are sexually active and are younger than 76 years of age.  You are older than 76 years of age and your health care provider tells you that you are at risk for this type of infection.  Your sexual activity has changed since you were last screened and you are at an increased risk for chlamydia or gonorrhea. Ask your health care provider if you are at risk.  If you do not have HIV, but are at risk, it may be recommended that you take a prescription medicine daily to prevent HIV infection. This is called pre-exposure prophylaxis  (PrEP). You are considered at risk if:  You are sexually active and do not regularly use condoms or know the HIV status of your partner(s).  You take drugs by injection.  You are sexually active with a partner who has HIV. Talk with your health care provider about whether you are at high risk of being infected with HIV. If you choose to begin PrEP, you should first be tested for HIV. You should then be tested  every 3 months for as long as you are taking PrEP.  PREGNANCY   If you are premenopausal and you may become pregnant, ask your health care provider about preconception counseling.  If you may become pregnant, take 400 to 800 micrograms (mcg) of folic acid every day.  If you want to prevent pregnancy, talk to your health care provider about birth control (contraception). OSTEOPOROSIS AND MENOPAUSE   Osteoporosis is a disease in which the bones lose minerals and strength with aging. This can result in serious bone fractures. Your risk for osteoporosis can be identified using a bone density scan.  If you are 58 years of age or older, or if you are at risk for osteoporosis and fractures, ask your health care provider if you should be screened.  Ask your health care provider whether you should take a calcium or vitamin D supplement to lower your risk for osteoporosis.  Menopause may have certain physical symptoms and risks.  Hormone replacement therapy may reduce some of these symptoms and risks. Talk to your health care provider about whether hormone replacement therapy is right for you.  HOME CARE INSTRUCTIONS   Schedule regular health, dental, and eye exams.  Stay current with your immunizations.   Do not use any tobacco products including cigarettes, chewing tobacco, or electronic cigarettes.  If you are pregnant, do not drink alcohol.  If you are breastfeeding, limit how much and how often you drink alcohol.  Limit alcohol intake to no more than 1 drink per day for  nonpregnant women. One drink equals 12 ounces of beer, 5 ounces of wine, or 1 ounces of hard liquor.  Do not use street drugs.  Do not share needles.  Ask your health care provider for help if you need support or information about quitting drugs.  Tell your health care provider if you often feel depressed.  Tell your health care provider if you have ever been abused or do not feel safe at home.   This information is not intended to replace advice given to you by your health care provider. Make sure you discuss any questions you have with your health care provider.   Document Released: 09/22/2010 Document Revised: 03/30/2014 Document Reviewed: 02/08/2013 Elsevier Interactive Patient Education [employer].

## 2015-08-28 ENCOUNTER — Other Ambulatory Visit: Payer: Self-pay | Admitting: Nurse Practitioner

## 2015-09-16 ENCOUNTER — Other Ambulatory Visit: Payer: Self-pay | Admitting: Nurse Practitioner

## 2015-09-16 ENCOUNTER — Other Ambulatory Visit: Payer: Self-pay | Admitting: Internal Medicine

## 2015-09-16 NOTE — Telephone Encounter (Signed)
Medication no longer on her list it was discontinued. Please advise?

## 2015-09-16 NOTE — Telephone Encounter (Signed)
Please advise to refill Dr Lacinda Axon patient.

## 2015-09-17 NOTE — Telephone Encounter (Signed)
Faxed

## 2015-10-14 ENCOUNTER — Other Ambulatory Visit: Payer: Self-pay | Admitting: Surgical

## 2015-10-14 ENCOUNTER — Encounter: Payer: Self-pay | Admitting: Family Medicine

## 2015-10-14 ENCOUNTER — Ambulatory Visit (INDEPENDENT_AMBULATORY_CARE_PROVIDER_SITE_OTHER): Payer: Medicare Other | Admitting: Family Medicine

## 2015-10-14 DIAGNOSIS — J0100 Acute maxillary sinusitis, unspecified: Secondary | ICD-10-CM

## 2015-10-14 MED ORDER — AMOXICILLIN-POT CLAVULANATE 875-125 MG PO TABS: 1.0000 | ORAL_TABLET | Freq: Two times a day (BID) | ORAL | 0 refills | Status: DC

## 2015-10-14 NOTE — Patient Instructions (Signed)
Nice to meet you. You likely have a sinus infection. We'll treat you with Augmentin. If you develop worsening symptoms, shortness breath, cough productive of blood, fevers, or any new or changing symptoms please seek medical attention.

## 2015-10-14 NOTE — Progress Notes (Signed)
  Tommi Rumps, MD Phone: [phone]  Audrey Cross is a 75 y.o. female who presents today for same-day visit.  Patient presents with 3 weeks of sore throat, postnasal drip, maxillary sinus pressure, and cough. Notes cough is worsened. No chest congestion. Minimal wheezing. Not coughing much up. No fevers or shortness of breath. Has been taking Tussionex for her cough when sleeping.  ROS see history of present illness  Objective  Physical Exam Vitals:   10/14/15 1610  BP: 126/80  Pulse: 71  Temp: 98.2 F (36.8 C)    BP Readings from Last 3 Encounters:  10/14/15 126/80  08/22/15 114/70  06/21/15 130/68   Wt Readings from Last 3 Encounters:  10/14/15 133 lb 6 oz (60.5 kg)  08/22/15 133 lb (60.3 kg)  06/21/15 134 lb 3.2 oz (60.9 kg)    Physical Exam  Constitutional: No distress.  HENT:  Head: Normocephalic and atraumatic.  Right Ear: External ear normal.  Left Ear: External ear normal.  Mouth/Throat: Oropharynx is clear and moist. No oropharyngeal exudate.  Normal TMs bilaterally, mild tenderness to percussion bilateral maxillary sinuses  Eyes: Conjunctivae are normal. Pupils are equal, round, and reactive to light.  Cardiovascular: Normal rate, regular rhythm and normal heart sounds.   Pulmonary/Chest: Effort normal and breath sounds normal.  Neurological: She is alert. Gait normal.  Skin: Skin is dry. She is not diaphoretic.     Assessment/Plan: Please see individual problem list.  Maxillary sinusitis, acute Symptoms most consistent with maxillary sinusitis. Not improving after 3 weeks. We will treat with Augmentin. Advised on probiotics or yogurt. She can continue Tussionex at home for cough. Given return precautions.   No orders of the defined types were placed in this encounter.   Meds ordered this encounter  Medications  . amoxicillin-clavulanate (AUGMENTIN) 875-125 MG tablet    Sig: Take 1 tablet by mouth 2 (two) times daily.    Dispense:  14  tablet    Refill:  0    Tommi Rumps, MD [place]

## 2015-10-14 NOTE — Telephone Encounter (Signed)
Patient seen Dr. Caryl Bis today and requested a refill on both of these medications. Can you refill?

## 2015-10-14 NOTE — Assessment & Plan Note (Signed)
Symptoms most consistent with maxillary sinusitis. Not improving after 3 weeks. We will treat with Augmentin. Advised on probiotics or yogurt. She can continue Tussionex at home for cough. Given return precautions.

## 2015-10-15 MED ORDER — BUTALBITAL-APAP-CAFFEINE 50-325-40 MG PO TABS: ORAL_TABLET | ORAL | 0 refills | Status: AC

## 2015-10-15 MED ORDER — KETOPROFEN 75 MG PO CAPS: 75.0000 mg | ORAL_CAPSULE | Freq: Three times a day (TID) | ORAL | 0 refills | Status: AC | PRN

## 2015-10-15 NOTE — Telephone Encounter (Signed)
Prescription faxed

## 2015-10-24 ENCOUNTER — Encounter: Payer: Self-pay | Admitting: Family Medicine

## 2015-10-27 ENCOUNTER — Other Ambulatory Visit: Payer: Self-pay | Admitting: Nurse Practitioner

## 2015-10-28 NOTE — Telephone Encounter (Signed)
Refilled 10/2014. Patient last seen 08/22/15. Please advise?

## 2015-11-14 ENCOUNTER — Ambulatory Visit
Admission: RE | Admit: 2015-11-14 | Discharge: 2015-11-14 | Disposition: A | Discharge status: Home / Self Care | Payer: Medicare Other | Source: Ambulatory Visit | Attending: Family Medicine | Admitting: Family Medicine

## 2015-11-14 DIAGNOSIS — E2839 Other primary ovarian failure: Secondary | ICD-10-CM

## 2015-11-14 DIAGNOSIS — Z13820 Encounter for screening for osteoporosis: Secondary | ICD-10-CM | POA: Insufficient documentation

## 2015-11-14 DIAGNOSIS — M8588 Other specified disorders of bone density and structure, other site: Secondary | ICD-10-CM | POA: Diagnosis not present

## 2015-11-14 DIAGNOSIS — Z780 Asymptomatic menopausal state: Secondary | ICD-10-CM | POA: Diagnosis not present

## 2015-11-14 DIAGNOSIS — M8580 Other specified disorders of bone density and structure, unspecified site: Secondary | ICD-10-CM | POA: Insufficient documentation
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
## 2015-12-11 ENCOUNTER — Ambulatory Visit
Admission: RE | Admit: 2015-12-11 | Discharge: 2015-12-11 | Disposition: A | Discharge status: Home / Self Care | Payer: Medicare Other | Source: Ambulatory Visit | Attending: Internal Medicine | Admitting: Internal Medicine

## 2015-12-11 DIAGNOSIS — R911 Solitary pulmonary nodule: Secondary | ICD-10-CM

## 2015-12-11 DIAGNOSIS — R918 Other nonspecific abnormal finding of lung field: Secondary | ICD-10-CM | POA: Insufficient documentation

## 2015-12-12 ENCOUNTER — Encounter: Payer: Self-pay | Admitting: Internal Medicine

## 2015-12-12 ENCOUNTER — Ambulatory Visit (INDEPENDENT_AMBULATORY_CARE_PROVIDER_SITE_OTHER): Payer: Medicare Other | Admitting: Internal Medicine

## 2015-12-12 VITALS — BP 136/72 | HR 89 | Ht 64.0 in | Wt 135.0 lb

## 2015-12-12 DIAGNOSIS — R911 Solitary pulmonary nodule: Secondary | ICD-10-CM | POA: Diagnosis not present

## 2015-12-12 MED ORDER — SUVOREXANT 5 MG PO TABS: 5.0000 mg | ORAL_TABLET | Freq: Every evening | ORAL | 3 refills | Status: AC | PRN

## 2015-12-12 NOTE — Addendum Note (Signed)
Addended by: Oscar La R on: 12/12/2015 09:15 AM   Modules accepted: Orders

## 2015-12-12 NOTE — Patient Instructions (Addendum)
--  Will repeat CT chest in 9 months.   --Will start belsomra 5 mg at bedtime as needed, 30 min before bedtime. May increase to 2 tablets if needed.

## 2015-12-30 DIAGNOSIS — Z79899 Other long term (current) drug therapy: Secondary | ICD-10-CM | POA: Diagnosis not present

## 2015-12-30 DIAGNOSIS — Z049 Encounter for examination and observation for unspecified reason: Secondary | ICD-10-CM | POA: Diagnosis not present

## 2015-12-30 DIAGNOSIS — R51 Headache: Secondary | ICD-10-CM | POA: Diagnosis not present

## 2015-12-31 ENCOUNTER — Other Ambulatory Visit: Payer: Self-pay | Admitting: Specialist

## 2015-12-31 DIAGNOSIS — R413 Other amnesia: Secondary | ICD-10-CM

## 2016-01-12 ENCOUNTER — Ambulatory Visit
Admission: RE | Admit: 2016-01-12 | Discharge: 2016-01-12 | Disposition: A | Discharge status: Home / Self Care | Payer: Medicare Other | Source: Ambulatory Visit | Attending: Specialist | Admitting: Specialist

## 2016-01-12 DIAGNOSIS — R413 Other amnesia: Secondary | ICD-10-CM

## 2016-01-12 DIAGNOSIS — R51 Headache: Secondary | ICD-10-CM | POA: Diagnosis not present

## 2016-01-13 DIAGNOSIS — G518 Other disorders of facial nerve: Secondary | ICD-10-CM | POA: Diagnosis not present

## 2016-01-13 DIAGNOSIS — M791 Myalgia: Secondary | ICD-10-CM | POA: Diagnosis not present

## 2016-01-13 DIAGNOSIS — R51 Headache: Secondary | ICD-10-CM | POA: Diagnosis not present

## 2016-01-13 DIAGNOSIS — M542 Cervicalgia: Secondary | ICD-10-CM | POA: Diagnosis not present

## 2016-02-03 DIAGNOSIS — M542 Cervicalgia: Secondary | ICD-10-CM | POA: Diagnosis not present

## 2016-02-03 DIAGNOSIS — R51 Headache: Secondary | ICD-10-CM | POA: Diagnosis not present

## 2016-02-03 DIAGNOSIS — M791 Myalgia: Secondary | ICD-10-CM | POA: Diagnosis not present

## 2016-02-03 DIAGNOSIS — G518 Other disorders of facial nerve: Secondary | ICD-10-CM | POA: Diagnosis not present

## 2016-02-17 DIAGNOSIS — I10 Essential (primary) hypertension: Secondary | ICD-10-CM | POA: Diagnosis not present

## 2016-02-17 DIAGNOSIS — M791 Myalgia: Secondary | ICD-10-CM | POA: Diagnosis not present

## 2016-02-17 DIAGNOSIS — R51 Headache: Secondary | ICD-10-CM | POA: Diagnosis not present

## 2016-02-17 DIAGNOSIS — M542 Cervicalgia: Secondary | ICD-10-CM | POA: Diagnosis not present

## 2016-02-17 DIAGNOSIS — Z79899 Other long term (current) drug therapy: Secondary | ICD-10-CM | POA: Diagnosis not present

## 2016-02-17 DIAGNOSIS — G518 Other disorders of facial nerve: Secondary | ICD-10-CM | POA: Diagnosis not present

## 2016-02-18 ENCOUNTER — Telehealth: Payer: Self-pay | Admitting: Family Medicine

## 2016-02-18 NOTE — Telephone Encounter (Signed)
Pt wants to know if she's due for any of the pneumonia shots and injections? Please advise?  Call pt @ [phone]. Thank you!

## 2016-02-19 NOTE — Telephone Encounter (Signed)
Per DPR a voicemail was left on pt home phone stating her pneumonia vaccines have been completed but she was due to for a flu.

## 2016-02-24 ENCOUNTER — Ambulatory Visit (INDEPENDENT_AMBULATORY_CARE_PROVIDER_SITE_OTHER): Payer: Medicare Other

## 2016-02-24 DIAGNOSIS — Z23 Encounter for immunization: Secondary | ICD-10-CM

## 2016-03-02 ENCOUNTER — Other Ambulatory Visit: Payer: Self-pay | Admitting: Family Medicine

## 2016-03-02 DIAGNOSIS — M542 Cervicalgia: Secondary | ICD-10-CM | POA: Diagnosis not present

## 2016-03-02 DIAGNOSIS — M791 Myalgia: Secondary | ICD-10-CM | POA: Diagnosis not present

## 2016-03-02 DIAGNOSIS — R51 Headache: Secondary | ICD-10-CM | POA: Diagnosis not present

## 2016-03-02 DIAGNOSIS — G518 Other disorders of facial nerve: Secondary | ICD-10-CM | POA: Diagnosis not present

## 2016-03-02 MED ORDER — LISINOPRIL 10 MG PO TABS: ORAL_TABLET | ORAL | 2 refills | Status: DC

## 2016-03-18 DIAGNOSIS — M791 Myalgia: Secondary | ICD-10-CM | POA: Diagnosis not present

## 2016-03-18 DIAGNOSIS — M542 Cervicalgia: Secondary | ICD-10-CM | POA: Diagnosis not present

## 2016-03-18 DIAGNOSIS — G518 Other disorders of facial nerve: Secondary | ICD-10-CM | POA: Diagnosis not present

## 2016-03-18 DIAGNOSIS — R51 Headache: Secondary | ICD-10-CM | POA: Diagnosis not present

## 2016-03-26 IMAGING — CR DG CHEST 2V
2 series · 2 of 2 positions shown · non-contrast
Comparison: None in PACs

CLINICAL DATA: Five days of fever and productive cough, history of
cervical malignancy, nonsmoker.

EXAM:
CHEST  2 VIEW

[view not recorded (1 of 2)]
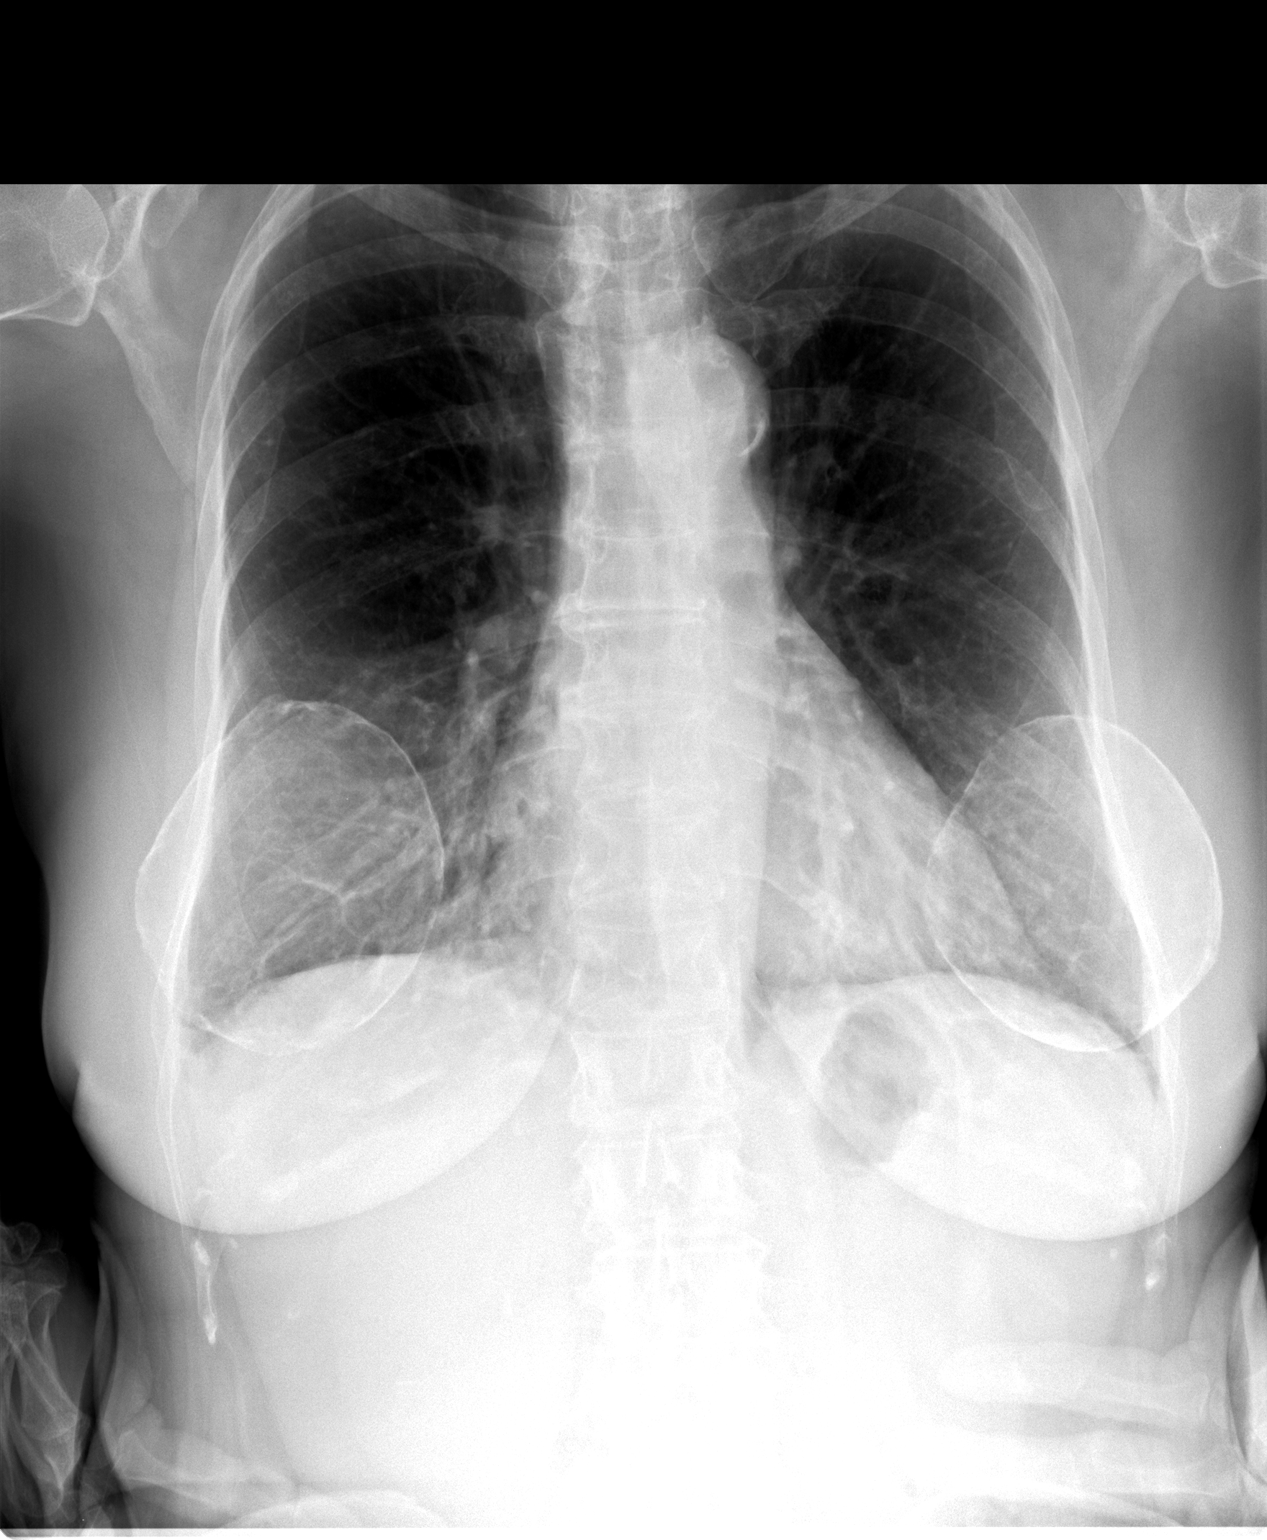

[view not recorded (2 of 2)]
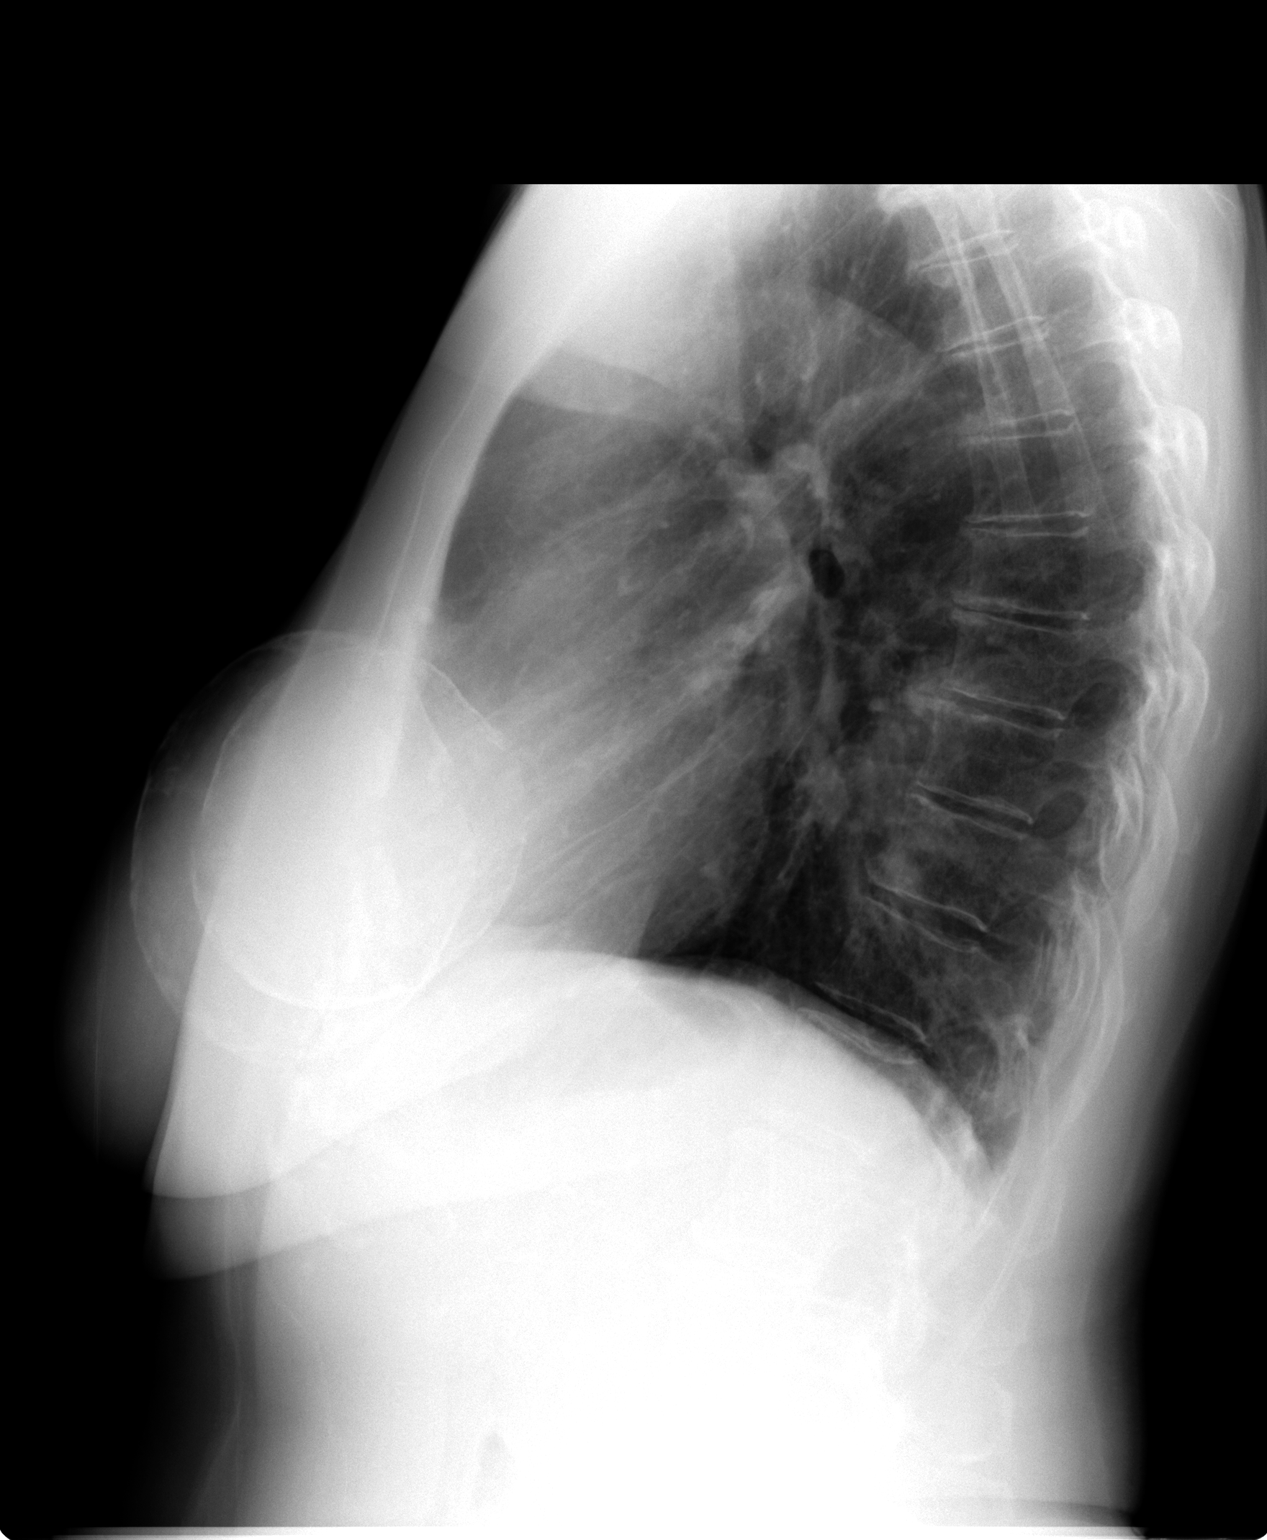

[2 of 2 positions shown; findings below may reference images not displayed]

FINDINGS: The lungs are well-expanded. There is are increased lung markings in
the left lower lobe posteriorly. There is no pleural effusion.
Capsular calcification of bilateral breast implants is noted. The
heart and pulmonary vascularity are normal. The mediastinum is
normal in width. There is no pleural effusion.
IMPRESSION: Left lower lobe subsegmental atelectasis or early pneumonia.
Followup PA and lateral chest X-ray is recommended in 3-4 weeks
following trial of antibiotic therapy to ensure resolution and
exclude underlying malignancy.

## 2016-04-24 DIAGNOSIS — R51 Headache: Secondary | ICD-10-CM | POA: Diagnosis not present

## 2016-04-24 DIAGNOSIS — M5481 Occipital neuralgia: Secondary | ICD-10-CM | POA: Diagnosis not present

## 2016-04-26 DIAGNOSIS — J069 Acute upper respiratory infection, unspecified: Secondary | ICD-10-CM | POA: Diagnosis not present

## 2016-04-26 DIAGNOSIS — R6889 Other general symptoms and signs: Secondary | ICD-10-CM | POA: Diagnosis not present

## 2016-05-11 DIAGNOSIS — R110 Nausea: Secondary | ICD-10-CM | POA: Diagnosis not present

## 2016-05-11 DIAGNOSIS — K529 Noninfective gastroenteritis and colitis, unspecified: Secondary | ICD-10-CM | POA: Diagnosis not present

## 2016-06-05 DIAGNOSIS — S39012A Strain of muscle, fascia and tendon of lower back, initial encounter: Secondary | ICD-10-CM | POA: Diagnosis not present

## 2016-06-05 DIAGNOSIS — M9903 Segmental and somatic dysfunction of lumbar region: Secondary | ICD-10-CM | POA: Diagnosis not present

## 2016-06-05 DIAGNOSIS — M47896 Other spondylosis, lumbar region: Secondary | ICD-10-CM | POA: Diagnosis not present

## 2016-06-05 DIAGNOSIS — M545 Low back pain: Secondary | ICD-10-CM | POA: Diagnosis not present

## 2016-06-10 DIAGNOSIS — M9903 Segmental and somatic dysfunction of lumbar region: Secondary | ICD-10-CM | POA: Diagnosis not present

## 2016-06-10 DIAGNOSIS — M47896 Other spondylosis, lumbar region: Secondary | ICD-10-CM | POA: Diagnosis not present

## 2016-06-10 DIAGNOSIS — S39012A Strain of muscle, fascia and tendon of lower back, initial encounter: Secondary | ICD-10-CM | POA: Diagnosis not present

## 2016-06-10 DIAGNOSIS — M545 Low back pain: Secondary | ICD-10-CM | POA: Diagnosis not present

## 2016-06-19 DIAGNOSIS — M47896 Other spondylosis, lumbar region: Secondary | ICD-10-CM | POA: Diagnosis not present

## 2016-06-19 DIAGNOSIS — S39012A Strain of muscle, fascia and tendon of lower back, initial encounter: Secondary | ICD-10-CM | POA: Diagnosis not present

## 2016-06-19 DIAGNOSIS — M545 Low back pain: Secondary | ICD-10-CM | POA: Diagnosis not present

## 2016-06-19 DIAGNOSIS — M9903 Segmental and somatic dysfunction of lumbar region: Secondary | ICD-10-CM | POA: Diagnosis not present

## 2016-06-24 DIAGNOSIS — M545 Low back pain: Secondary | ICD-10-CM | POA: Diagnosis not present

## 2016-06-24 DIAGNOSIS — M47896 Other spondylosis, lumbar region: Secondary | ICD-10-CM | POA: Diagnosis not present

## 2016-06-24 DIAGNOSIS — M9903 Segmental and somatic dysfunction of lumbar region: Secondary | ICD-10-CM | POA: Diagnosis not present

## 2016-06-24 DIAGNOSIS — S39012A Strain of muscle, fascia and tendon of lower back, initial encounter: Secondary | ICD-10-CM | POA: Diagnosis not present

## 2016-08-13 DIAGNOSIS — H25813 Combined forms of age-related cataract, bilateral: Secondary | ICD-10-CM | POA: Diagnosis not present

## 2016-08-13 DIAGNOSIS — H3589 Other specified retinal disorders: Secondary | ICD-10-CM | POA: Diagnosis not present

## 2016-08-13 DIAGNOSIS — H40013 Open angle with borderline findings, low risk, bilateral: Secondary | ICD-10-CM | POA: Diagnosis not present

## 2016-08-13 DIAGNOSIS — H16422 Pannus (corneal), left eye: Secondary | ICD-10-CM | POA: Diagnosis not present

## 2016-08-13 DIAGNOSIS — H3550 Unspecified hereditary retinal dystrophy: Secondary | ICD-10-CM | POA: Diagnosis not present

## 2016-08-18 DIAGNOSIS — R0602 Shortness of breath: Secondary | ICD-10-CM | POA: Diagnosis not present

## 2016-08-18 DIAGNOSIS — R062 Wheezing: Secondary | ICD-10-CM | POA: Diagnosis not present

## 2016-08-18 DIAGNOSIS — J209 Acute bronchitis, unspecified: Secondary | ICD-10-CM | POA: Diagnosis not present

## 2016-08-18 DIAGNOSIS — R05 Cough: Secondary | ICD-10-CM | POA: Diagnosis not present

## 2016-08-24 ENCOUNTER — Ambulatory Visit: Payer: Medicare Other

## 2016-08-27 DIAGNOSIS — H40013 Open angle with borderline findings, low risk, bilateral: Secondary | ICD-10-CM | POA: Diagnosis not present

## 2016-09-03 DIAGNOSIS — I10 Essential (primary) hypertension: Secondary | ICD-10-CM | POA: Diagnosis not present

## 2016-09-03 DIAGNOSIS — R05 Cough: Secondary | ICD-10-CM | POA: Diagnosis not present

## 2016-09-03 DIAGNOSIS — B009 Herpesviral infection, unspecified: Secondary | ICD-10-CM | POA: Diagnosis not present

## 2016-09-07 DIAGNOSIS — I10 Essential (primary) hypertension: Secondary | ICD-10-CM | POA: Diagnosis not present

## 2016-09-08 ENCOUNTER — Other Ambulatory Visit: Payer: Self-pay

## 2016-09-08 MED ORDER — VALACYCLOVIR HCL 500 MG PO TABS: 500.0000 mg | ORAL_TABLET | Freq: Every day | ORAL | 0 refills | Status: AC

## 2016-09-11 DIAGNOSIS — J8410 Pulmonary fibrosis, unspecified: Secondary | ICD-10-CM | POA: Diagnosis not present

## 2016-09-11 DIAGNOSIS — R911 Solitary pulmonary nodule: Secondary | ICD-10-CM | POA: Diagnosis not present

## 2016-09-29 DIAGNOSIS — M9903 Segmental and somatic dysfunction of lumbar region: Secondary | ICD-10-CM | POA: Diagnosis not present

## 2016-09-29 DIAGNOSIS — M545 Low back pain: Secondary | ICD-10-CM | POA: Diagnosis not present

## 2016-09-29 DIAGNOSIS — S39012A Strain of muscle, fascia and tendon of lower back, initial encounter: Secondary | ICD-10-CM | POA: Diagnosis not present

## 2016-09-29 DIAGNOSIS — M47896 Other spondylosis, lumbar region: Secondary | ICD-10-CM | POA: Diagnosis not present

## 2016-10-02 DIAGNOSIS — S39012A Strain of muscle, fascia and tendon of lower back, initial encounter: Secondary | ICD-10-CM | POA: Diagnosis not present

## 2016-10-02 DIAGNOSIS — M545 Low back pain: Secondary | ICD-10-CM | POA: Diagnosis not present

## 2016-10-02 DIAGNOSIS — M9903 Segmental and somatic dysfunction of lumbar region: Secondary | ICD-10-CM | POA: Diagnosis not present

## 2016-10-02 DIAGNOSIS — M47896 Other spondylosis, lumbar region: Secondary | ICD-10-CM | POA: Diagnosis not present

## 2016-10-21 DIAGNOSIS — R51 Headache: Secondary | ICD-10-CM | POA: Diagnosis not present

## 2016-10-21 DIAGNOSIS — G8929 Other chronic pain: Secondary | ICD-10-CM | POA: Diagnosis not present

## 2016-10-28 DIAGNOSIS — M461 Sacroiliitis, not elsewhere classified: Secondary | ICD-10-CM | POA: Diagnosis not present

## 2016-10-28 DIAGNOSIS — R531 Weakness: Secondary | ICD-10-CM | POA: Diagnosis not present

## 2016-10-28 DIAGNOSIS — M1612 Unilateral primary osteoarthritis, left hip: Secondary | ICD-10-CM | POA: Diagnosis not present

## 2016-10-28 DIAGNOSIS — R262 Difficulty in walking, not elsewhere classified: Secondary | ICD-10-CM | POA: Diagnosis not present

## 2016-10-28 DIAGNOSIS — M545 Low back pain: Secondary | ICD-10-CM | POA: Diagnosis not present

## 2016-10-28 DIAGNOSIS — M7062 Trochanteric bursitis, left hip: Secondary | ICD-10-CM | POA: Diagnosis not present

## 2016-10-28 DIAGNOSIS — M5432 Sciatica, left side: Secondary | ICD-10-CM | POA: Diagnosis not present

## 2016-11-02 ENCOUNTER — Telehealth: Payer: Self-pay | Admitting: Family Medicine

## 2016-11-02 NOTE — Telephone Encounter (Signed)
Called pt to schedule AWV. Phone # disconnected.  Last AWV 08/22/15

## 2016-11-04 DIAGNOSIS — R531 Weakness: Secondary | ICD-10-CM | POA: Diagnosis not present

## 2016-11-04 DIAGNOSIS — M545 Low back pain: Secondary | ICD-10-CM | POA: Diagnosis not present

## 2016-11-04 DIAGNOSIS — M1612 Unilateral primary osteoarthritis, left hip: Secondary | ICD-10-CM | POA: Diagnosis not present

## 2016-11-04 DIAGNOSIS — M461 Sacroiliitis, not elsewhere classified: Secondary | ICD-10-CM | POA: Diagnosis not present

## 2016-11-04 DIAGNOSIS — R262 Difficulty in walking, not elsewhere classified: Secondary | ICD-10-CM | POA: Diagnosis not present

## 2016-11-04 DIAGNOSIS — M5432 Sciatica, left side: Secondary | ICD-10-CM | POA: Diagnosis not present

## 2016-11-04 DIAGNOSIS — M7062 Trochanteric bursitis, left hip: Secondary | ICD-10-CM | POA: Diagnosis not present

## 2016-11-06 DIAGNOSIS — R531 Weakness: Secondary | ICD-10-CM | POA: Diagnosis not present

## 2016-11-06 DIAGNOSIS — M461 Sacroiliitis, not elsewhere classified: Secondary | ICD-10-CM | POA: Diagnosis not present

## 2016-11-06 DIAGNOSIS — M545 Low back pain: Secondary | ICD-10-CM | POA: Diagnosis not present

## 2016-11-06 DIAGNOSIS — M7062 Trochanteric bursitis, left hip: Secondary | ICD-10-CM | POA: Diagnosis not present

## 2016-11-06 DIAGNOSIS — M5432 Sciatica, left side: Secondary | ICD-10-CM | POA: Diagnosis not present

## 2016-11-06 DIAGNOSIS — M1612 Unilateral primary osteoarthritis, left hip: Secondary | ICD-10-CM | POA: Diagnosis not present

## 2016-11-06 DIAGNOSIS — R262 Difficulty in walking, not elsewhere classified: Secondary | ICD-10-CM | POA: Diagnosis not present

## 2016-11-11 DIAGNOSIS — R531 Weakness: Secondary | ICD-10-CM | POA: Diagnosis not present

## 2016-11-11 DIAGNOSIS — M5432 Sciatica, left side: Secondary | ICD-10-CM | POA: Diagnosis not present

## 2016-11-11 DIAGNOSIS — M1612 Unilateral primary osteoarthritis, left hip: Secondary | ICD-10-CM | POA: Diagnosis not present

## 2016-11-11 DIAGNOSIS — M7062 Trochanteric bursitis, left hip: Secondary | ICD-10-CM | POA: Diagnosis not present

## 2016-11-11 DIAGNOSIS — M461 Sacroiliitis, not elsewhere classified: Secondary | ICD-10-CM | POA: Diagnosis not present

## 2016-11-11 DIAGNOSIS — M545 Low back pain: Secondary | ICD-10-CM | POA: Diagnosis not present

## 2016-11-11 DIAGNOSIS — R262 Difficulty in walking, not elsewhere classified: Secondary | ICD-10-CM | POA: Diagnosis not present

## 2016-11-16 DIAGNOSIS — M1612 Unilateral primary osteoarthritis, left hip: Secondary | ICD-10-CM | POA: Diagnosis not present

## 2016-11-16 DIAGNOSIS — M461 Sacroiliitis, not elsewhere classified: Secondary | ICD-10-CM | POA: Diagnosis not present

## 2016-11-16 DIAGNOSIS — M5432 Sciatica, left side: Secondary | ICD-10-CM | POA: Diagnosis not present

## 2016-11-16 DIAGNOSIS — R531 Weakness: Secondary | ICD-10-CM | POA: Diagnosis not present

## 2016-11-16 DIAGNOSIS — M7062 Trochanteric bursitis, left hip: Secondary | ICD-10-CM | POA: Diagnosis not present

## 2016-11-16 DIAGNOSIS — R262 Difficulty in walking, not elsewhere classified: Secondary | ICD-10-CM | POA: Diagnosis not present

## 2016-11-16 DIAGNOSIS — M545 Low back pain: Secondary | ICD-10-CM | POA: Diagnosis not present

## 2016-11-24 DIAGNOSIS — R7301 Impaired fasting glucose: Secondary | ICD-10-CM | POA: Diagnosis not present

## 2016-11-24 DIAGNOSIS — B009 Herpesviral infection, unspecified: Secondary | ICD-10-CM | POA: Diagnosis not present

## 2016-11-24 DIAGNOSIS — M4692 Unspecified inflammatory spondylopathy, cervical region: Secondary | ICD-10-CM | POA: Diagnosis not present

## 2016-11-24 DIAGNOSIS — M25552 Pain in left hip: Secondary | ICD-10-CM | POA: Diagnosis not present

## 2016-11-24 DIAGNOSIS — G43909 Migraine, unspecified, not intractable, without status migrainosus: Secondary | ICD-10-CM | POA: Diagnosis not present

## 2016-11-24 DIAGNOSIS — I10 Essential (primary) hypertension: Secondary | ICD-10-CM | POA: Diagnosis not present

## 2016-11-25 DIAGNOSIS — M545 Low back pain: Secondary | ICD-10-CM | POA: Diagnosis not present

## 2016-11-25 DIAGNOSIS — M5432 Sciatica, left side: Secondary | ICD-10-CM | POA: Diagnosis not present

## 2016-11-25 DIAGNOSIS — M7062 Trochanteric bursitis, left hip: Secondary | ICD-10-CM | POA: Diagnosis not present

## 2016-11-25 DIAGNOSIS — M1612 Unilateral primary osteoarthritis, left hip: Secondary | ICD-10-CM | POA: Diagnosis not present

## 2016-11-25 DIAGNOSIS — R531 Weakness: Secondary | ICD-10-CM | POA: Diagnosis not present

## 2016-11-25 DIAGNOSIS — M461 Sacroiliitis, not elsewhere classified: Secondary | ICD-10-CM | POA: Diagnosis not present

## 2016-11-25 DIAGNOSIS — R262 Difficulty in walking, not elsewhere classified: Secondary | ICD-10-CM | POA: Diagnosis not present

## 2016-11-28 ENCOUNTER — Other Ambulatory Visit: Payer: Self-pay | Admitting: Family Medicine

## 2016-12-02 DIAGNOSIS — M545 Low back pain: Secondary | ICD-10-CM | POA: Diagnosis not present

## 2016-12-02 DIAGNOSIS — R262 Difficulty in walking, not elsewhere classified: Secondary | ICD-10-CM | POA: Diagnosis not present

## 2016-12-02 DIAGNOSIS — M5432 Sciatica, left side: Secondary | ICD-10-CM | POA: Diagnosis not present

## 2016-12-02 DIAGNOSIS — M1612 Unilateral primary osteoarthritis, left hip: Secondary | ICD-10-CM | POA: Diagnosis not present

## 2016-12-02 DIAGNOSIS — M7062 Trochanteric bursitis, left hip: Secondary | ICD-10-CM | POA: Diagnosis not present

## 2016-12-02 DIAGNOSIS — M461 Sacroiliitis, not elsewhere classified: Secondary | ICD-10-CM | POA: Diagnosis not present

## 2016-12-02 DIAGNOSIS — R531 Weakness: Secondary | ICD-10-CM | POA: Diagnosis not present

## 2016-12-07 DIAGNOSIS — R262 Difficulty in walking, not elsewhere classified: Secondary | ICD-10-CM | POA: Diagnosis not present

## 2016-12-07 DIAGNOSIS — M5432 Sciatica, left side: Secondary | ICD-10-CM | POA: Diagnosis not present

## 2016-12-07 DIAGNOSIS — M545 Low back pain: Secondary | ICD-10-CM | POA: Diagnosis not present

## 2016-12-07 DIAGNOSIS — R531 Weakness: Secondary | ICD-10-CM | POA: Diagnosis not present

## 2016-12-07 DIAGNOSIS — M7062 Trochanteric bursitis, left hip: Secondary | ICD-10-CM | POA: Diagnosis not present

## 2016-12-07 DIAGNOSIS — M461 Sacroiliitis, not elsewhere classified: Secondary | ICD-10-CM | POA: Diagnosis not present

## 2016-12-07 DIAGNOSIS — M1612 Unilateral primary osteoarthritis, left hip: Secondary | ICD-10-CM | POA: Diagnosis not present

## 2016-12-09 NOTE — Telephone Encounter (Signed)
Tried calling pt again. Home phone still disconnected

## 2016-12-15 DIAGNOSIS — M461 Sacroiliitis, not elsewhere classified: Secondary | ICD-10-CM | POA: Diagnosis not present

## 2016-12-15 DIAGNOSIS — M7062 Trochanteric bursitis, left hip: Secondary | ICD-10-CM | POA: Diagnosis not present

## 2016-12-15 DIAGNOSIS — M1612 Unilateral primary osteoarthritis, left hip: Secondary | ICD-10-CM | POA: Diagnosis not present

## 2016-12-15 DIAGNOSIS — R262 Difficulty in walking, not elsewhere classified: Secondary | ICD-10-CM | POA: Diagnosis not present

## 2016-12-15 DIAGNOSIS — M545 Low back pain: Secondary | ICD-10-CM | POA: Diagnosis not present

## 2016-12-15 DIAGNOSIS — R531 Weakness: Secondary | ICD-10-CM | POA: Diagnosis not present

## 2016-12-15 DIAGNOSIS — M5432 Sciatica, left side: Secondary | ICD-10-CM | POA: Diagnosis not present

## 2016-12-22 DIAGNOSIS — H16422 Pannus (corneal), left eye: Secondary | ICD-10-CM | POA: Diagnosis not present

## 2016-12-22 DIAGNOSIS — H10503 Unspecified blepharoconjunctivitis, bilateral: Secondary | ICD-10-CM | POA: Diagnosis not present

## 2017-02-05 DIAGNOSIS — I10 Essential (primary) hypertension: Secondary | ICD-10-CM | POA: Diagnosis not present

## 2017-02-05 DIAGNOSIS — M316 Other giant cell arteritis: Secondary | ICD-10-CM | POA: Diagnosis not present

## 2017-02-05 DIAGNOSIS — J019 Acute sinusitis, unspecified: Secondary | ICD-10-CM | POA: Diagnosis not present

## 2017-02-05 DIAGNOSIS — M25552 Pain in left hip: Secondary | ICD-10-CM | POA: Diagnosis not present

## 2017-02-05 DIAGNOSIS — R05 Cough: Secondary | ICD-10-CM | POA: Diagnosis not present

## 2017-02-05 DIAGNOSIS — M1612 Unilateral primary osteoarthritis, left hip: Secondary | ICD-10-CM | POA: Diagnosis not present

## 2017-02-18 DIAGNOSIS — M25552 Pain in left hip: Secondary | ICD-10-CM | POA: Diagnosis not present

## 2017-02-18 DIAGNOSIS — M1610 Unilateral primary osteoarthritis, unspecified hip: Secondary | ICD-10-CM | POA: Diagnosis not present

## 2017-02-23 DIAGNOSIS — H40013 Open angle with borderline findings, low risk, bilateral: Secondary | ICD-10-CM | POA: Diagnosis not present

## 2017-02-23 DIAGNOSIS — H3589 Other specified retinal disorders: Secondary | ICD-10-CM | POA: Diagnosis not present

## 2017-02-23 DIAGNOSIS — H3550 Unspecified hereditary retinal dystrophy: Secondary | ICD-10-CM | POA: Diagnosis not present

## 2017-02-23 DIAGNOSIS — H25813 Combined forms of age-related cataract, bilateral: Secondary | ICD-10-CM | POA: Diagnosis not present

## 2017-04-12 DIAGNOSIS — M545 Low back pain: Secondary | ICD-10-CM | POA: Diagnosis not present

## 2017-04-12 DIAGNOSIS — D649 Anemia, unspecified: Secondary | ICD-10-CM | POA: Diagnosis not present

## 2017-04-12 DIAGNOSIS — E559 Vitamin D deficiency, unspecified: Secondary | ICD-10-CM | POA: Diagnosis not present

## 2017-04-12 DIAGNOSIS — M25552 Pain in left hip: Secondary | ICD-10-CM | POA: Diagnosis not present

## 2017-04-12 DIAGNOSIS — R7309 Other abnormal glucose: Secondary | ICD-10-CM | POA: Diagnosis not present

## 2017-04-12 DIAGNOSIS — M1612 Unilateral primary osteoarthritis, left hip: Secondary | ICD-10-CM | POA: Diagnosis not present

## 2017-05-12 DIAGNOSIS — M25559 Pain in unspecified hip: Secondary | ICD-10-CM | POA: Diagnosis not present

## 2017-05-12 DIAGNOSIS — R51 Headache: Secondary | ICD-10-CM | POA: Diagnosis not present

## 2017-05-27 DIAGNOSIS — M316 Other giant cell arteritis: Secondary | ICD-10-CM | POA: Diagnosis not present

## 2017-05-27 DIAGNOSIS — I10 Essential (primary) hypertension: Secondary | ICD-10-CM | POA: Diagnosis not present

## 2017-06-30 DIAGNOSIS — J0100 Acute maxillary sinusitis, unspecified: Secondary | ICD-10-CM | POA: Diagnosis not present

## 2017-07-08 DIAGNOSIS — J00 Acute nasopharyngitis [common cold]: Secondary | ICD-10-CM | POA: Diagnosis not present

## 2017-07-08 DIAGNOSIS — Z8673 Personal history of transient ischemic attack (TIA), and cerebral infarction without residual deficits: Secondary | ICD-10-CM | POA: Diagnosis not present

## 2017-07-08 DIAGNOSIS — I10 Essential (primary) hypertension: Secondary | ICD-10-CM | POA: Diagnosis not present

## 2017-07-08 DIAGNOSIS — Z01818 Encounter for other preprocedural examination: Secondary | ICD-10-CM | POA: Diagnosis not present

## 2017-07-08 DIAGNOSIS — M1612 Unilateral primary osteoarthritis, left hip: Secondary | ICD-10-CM | POA: Diagnosis not present

## 2017-07-08 DIAGNOSIS — Z01810 Encounter for preprocedural cardiovascular examination: Secondary | ICD-10-CM | POA: Diagnosis not present

## 2017-07-27 DIAGNOSIS — Z471 Aftercare following joint replacement surgery: Secondary | ICD-10-CM | POA: Diagnosis not present

## 2017-07-27 DIAGNOSIS — Z8673 Personal history of transient ischemic attack (TIA), and cerebral infarction without residual deficits: Secondary | ICD-10-CM | POA: Diagnosis not present

## 2017-07-27 DIAGNOSIS — M1612 Unilateral primary osteoarthritis, left hip: Secondary | ICD-10-CM | POA: Diagnosis not present

## 2017-07-27 DIAGNOSIS — I10 Essential (primary) hypertension: Secondary | ICD-10-CM | POA: Diagnosis not present

## 2017-07-27 DIAGNOSIS — Z96642 Presence of left artificial hip joint: Secondary | ICD-10-CM | POA: Diagnosis not present

## 2017-07-27 DIAGNOSIS — Z981 Arthrodesis status: Secondary | ICD-10-CM | POA: Diagnosis not present

## 2017-07-27 DIAGNOSIS — M25552 Pain in left hip: Secondary | ICD-10-CM | POA: Diagnosis not present

## 2017-07-28 ENCOUNTER — Telehealth: Payer: Self-pay | Admitting: Internal Medicine

## 2017-07-28 ENCOUNTER — Encounter: Payer: Self-pay | Admitting: Internal Medicine

## 2017-07-28 NOTE — Telephone Encounter (Signed)
3 attempts to schedule fu appt from recall list.   Deleting recall.  Mailed letter

## 2017-07-28 NOTE — Telephone Encounter (Signed)
Thanks. Btw, you do not need to send me these, just filing them to the chart is fine.

## 2017-08-09 DIAGNOSIS — J029 Acute pharyngitis, unspecified: Secondary | ICD-10-CM | POA: Diagnosis not present

## 2017-08-09 DIAGNOSIS — B370 Candidal stomatitis: Secondary | ICD-10-CM | POA: Diagnosis not present

## 2017-08-11 DIAGNOSIS — Z96642 Presence of left artificial hip joint: Secondary | ICD-10-CM | POA: Diagnosis not present

## 2017-08-11 DIAGNOSIS — M25552 Pain in left hip: Secondary | ICD-10-CM | POA: Diagnosis not present

## 2017-09-29 DIAGNOSIS — M47896 Other spondylosis, lumbar region: Secondary | ICD-10-CM | POA: Diagnosis not present

## 2017-09-29 DIAGNOSIS — M545 Low back pain: Secondary | ICD-10-CM | POA: Diagnosis not present

## 2017-09-29 DIAGNOSIS — S39012A Strain of muscle, fascia and tendon of lower back, initial encounter: Secondary | ICD-10-CM | POA: Diagnosis not present

## 2017-09-29 DIAGNOSIS — M9903 Segmental and somatic dysfunction of lumbar region: Secondary | ICD-10-CM | POA: Diagnosis not present

## 2017-10-06 DIAGNOSIS — M9903 Segmental and somatic dysfunction of lumbar region: Secondary | ICD-10-CM | POA: Diagnosis not present

## 2017-10-06 DIAGNOSIS — M47896 Other spondylosis, lumbar region: Secondary | ICD-10-CM | POA: Diagnosis not present

## 2017-10-06 DIAGNOSIS — S39012A Strain of muscle, fascia and tendon of lower back, initial encounter: Secondary | ICD-10-CM | POA: Diagnosis not present

## 2017-10-06 DIAGNOSIS — M545 Low back pain: Secondary | ICD-10-CM | POA: Diagnosis not present

## 2017-10-13 DIAGNOSIS — M545 Low back pain: Secondary | ICD-10-CM | POA: Diagnosis not present

## 2017-10-13 DIAGNOSIS — M47896 Other spondylosis, lumbar region: Secondary | ICD-10-CM | POA: Diagnosis not present

## 2017-10-13 DIAGNOSIS — S39012A Strain of muscle, fascia and tendon of lower back, initial encounter: Secondary | ICD-10-CM | POA: Diagnosis not present

## 2017-10-13 DIAGNOSIS — M9903 Segmental and somatic dysfunction of lumbar region: Secondary | ICD-10-CM | POA: Diagnosis not present

## 2017-10-20 DIAGNOSIS — M47896 Other spondylosis, lumbar region: Secondary | ICD-10-CM | POA: Diagnosis not present

## 2017-10-20 DIAGNOSIS — S39012A Strain of muscle, fascia and tendon of lower back, initial encounter: Secondary | ICD-10-CM | POA: Diagnosis not present

## 2017-10-20 DIAGNOSIS — M9903 Segmental and somatic dysfunction of lumbar region: Secondary | ICD-10-CM | POA: Diagnosis not present

## 2017-10-20 DIAGNOSIS — M545 Low back pain: Secondary | ICD-10-CM | POA: Diagnosis not present

## 2017-11-01 DIAGNOSIS — M25552 Pain in left hip: Secondary | ICD-10-CM | POA: Diagnosis not present

## 2017-11-01 DIAGNOSIS — Z96642 Presence of left artificial hip joint: Secondary | ICD-10-CM | POA: Diagnosis not present
